# Patient Record
Sex: Female | Born: 1945 | Race: White | Hispanic: No | Marital: Married | Smoking: Never smoker
Health system: Southern US, Community
[De-identification: ages and names within clinical notes are randomized; demographics above are authoritative.]

## PROBLEM LIST (undated history)

## (undated) DIAGNOSIS — Z9889 Other specified postprocedural states: Secondary | ICD-10-CM

## (undated) DIAGNOSIS — M810 Age-related osteoporosis without current pathological fracture: Secondary | ICD-10-CM

## (undated) DIAGNOSIS — I2510 Atherosclerotic heart disease of native coronary artery without angina pectoris: Secondary | ICD-10-CM

## (undated) DIAGNOSIS — M1990 Unspecified osteoarthritis, unspecified site: Secondary | ICD-10-CM

## (undated) DIAGNOSIS — C50919 Malignant neoplasm of unspecified site of unspecified female breast: Secondary | ICD-10-CM

## (undated) DIAGNOSIS — C801 Malignant (primary) neoplasm, unspecified: Secondary | ICD-10-CM

## (undated) DIAGNOSIS — I2089 Other forms of angina pectoris: Secondary | ICD-10-CM

## (undated) DIAGNOSIS — G500 Trigeminal neuralgia: Secondary | ICD-10-CM

## (undated) DIAGNOSIS — I471 Supraventricular tachycardia: Secondary | ICD-10-CM

## (undated) DIAGNOSIS — I208 Other forms of angina pectoris: Secondary | ICD-10-CM

## (undated) DIAGNOSIS — Z923 Personal history of irradiation: Secondary | ICD-10-CM

## (undated) DIAGNOSIS — Z974 Presence of external hearing-aid: Secondary | ICD-10-CM

## (undated) DIAGNOSIS — I10 Essential (primary) hypertension: Secondary | ICD-10-CM

## (undated) DIAGNOSIS — R112 Nausea with vomiting, unspecified: Secondary | ICD-10-CM

## (undated) HISTORY — DX: Other forms of angina pectoris: I20.8

## (undated) HISTORY — PX: CARDIAC CATHETERIZATION: SHX172

## (undated) HISTORY — DX: Age-related osteoporosis without current pathological fracture: M81.0

## (undated) HISTORY — DX: Supraventricular tachycardia: I47.1

## (undated) HISTORY — DX: Unspecified osteoarthritis, unspecified site: M19.90

## (undated) HISTORY — DX: Trigeminal neuralgia: G50.0

## (undated) HISTORY — DX: Malignant (primary) neoplasm, unspecified: C80.1

## (undated) HISTORY — DX: Other forms of angina pectoris: I20.89

## (undated) SURGERY — BREAST LUMPECTOMY
Anesthesia: General | Laterality: Right

---

## 1976-05-23 HISTORY — PX: VAGINAL HYSTERECTOMY: SUR661

## 1980-05-23 HISTORY — PX: BREAST FIBROADENOMA SURGERY: SHX580

## 1983-05-24 HISTORY — PX: BREAST CYST ASPIRATION: SHX578

## 1983-05-24 HISTORY — PX: BREAST BIOPSY: SHX20

## 2010-05-23 DIAGNOSIS — I471 Supraventricular tachycardia, unspecified: Secondary | ICD-10-CM

## 2010-05-23 HISTORY — DX: Supraventricular tachycardia, unspecified: I47.10

## 2010-05-23 HISTORY — PX: BUNIONECTOMY WITH HAMMERTOE RECONSTRUCTION: SHX5600

## 2010-05-23 HISTORY — DX: Supraventricular tachycardia: I47.1

## 2010-09-14 ENCOUNTER — Ambulatory Visit: Payer: Self-pay | Admitting: Internal Medicine

## 2010-11-16 ENCOUNTER — Ambulatory Visit: Payer: Self-pay | Admitting: Anesthesiology

## 2010-11-18 ENCOUNTER — Ambulatory Visit: Payer: Self-pay | Admitting: Podiatry

## 2011-01-13 ENCOUNTER — Ambulatory Visit: Payer: Self-pay | Admitting: Neurology

## 2011-01-18 ENCOUNTER — Ambulatory Visit: Payer: Self-pay | Admitting: Neurology

## 2011-05-24 HISTORY — PX: COLONOSCOPY: SHX174

## 2011-05-24 HISTORY — PX: HALLUX VALGUS CORRECTION: SUR315

## 2011-09-08 ENCOUNTER — Ambulatory Visit: Payer: Self-pay | Admitting: Unknown Physician Specialty

## 2011-10-12 ENCOUNTER — Ambulatory Visit: Payer: Self-pay | Admitting: Internal Medicine

## 2011-11-17 ENCOUNTER — Ambulatory Visit: Payer: Self-pay | Admitting: Podiatry

## 2012-01-31 ENCOUNTER — Ambulatory Visit: Payer: Self-pay | Admitting: Neurology

## 2012-05-23 HISTORY — PX: CRANIOPLASTY: SUR330

## 2012-09-06 DIAGNOSIS — M2030 Hallux varus (acquired), unspecified foot: Secondary | ICD-10-CM | POA: Insufficient documentation

## 2012-10-16 ENCOUNTER — Ambulatory Visit: Payer: Self-pay | Admitting: Internal Medicine

## 2012-12-28 ENCOUNTER — Ambulatory Visit: Payer: Self-pay | Admitting: Neurology

## 2013-03-21 DIAGNOSIS — I471 Supraventricular tachycardia: Secondary | ICD-10-CM | POA: Insufficient documentation

## 2013-06-11 DIAGNOSIS — G500 Trigeminal neuralgia: Secondary | ICD-10-CM | POA: Insufficient documentation

## 2013-06-11 DIAGNOSIS — H532 Diplopia: Secondary | ICD-10-CM | POA: Insufficient documentation

## 2013-06-12 DIAGNOSIS — Z79899 Other long term (current) drug therapy: Secondary | ICD-10-CM | POA: Insufficient documentation

## 2013-10-22 ENCOUNTER — Ambulatory Visit: Payer: Self-pay | Admitting: Internal Medicine

## 2014-05-23 DIAGNOSIS — Z923 Personal history of irradiation: Secondary | ICD-10-CM

## 2014-05-23 DIAGNOSIS — C50919 Malignant neoplasm of unspecified site of unspecified female breast: Secondary | ICD-10-CM

## 2014-05-23 HISTORY — DX: Malignant neoplasm of unspecified site of unspecified female breast: C50.919

## 2014-05-23 HISTORY — DX: Personal history of irradiation: Z92.3

## 2014-12-29 ENCOUNTER — Other Ambulatory Visit: Payer: Self-pay | Admitting: Internal Medicine

## 2014-12-29 DIAGNOSIS — Z1231 Encounter for screening mammogram for malignant neoplasm of breast: Secondary | ICD-10-CM

## 2014-12-30 ENCOUNTER — Other Ambulatory Visit (HOSPITAL_COMMUNITY): Payer: Self-pay | Admitting: Surgical

## 2014-12-30 DIAGNOSIS — D496 Neoplasm of unspecified behavior of brain: Secondary | ICD-10-CM

## 2015-01-20 ENCOUNTER — Ambulatory Visit
Admission: RE | Admit: 2015-01-20 | Discharge: 2015-01-20 | Disposition: A | Discharge status: Home / Self Care | Payer: Medicare Other | Source: Ambulatory Visit | Attending: Surgical | Admitting: Surgical

## 2015-01-20 DIAGNOSIS — D496 Neoplasm of unspecified behavior of brain: Secondary | ICD-10-CM

## 2015-01-20 DIAGNOSIS — D320 Benign neoplasm of cerebral meninges: Secondary | ICD-10-CM | POA: Insufficient documentation

## 2015-01-20 MED ORDER — GADOBENATE DIMEGLUMINE 529 MG/ML IV SOLN
15.0000 mL | Freq: Once | INTRAVENOUS | Status: AC | PRN
  Administered 2015-01-20: 12 mL via INTRAVENOUS

## 2015-01-28 ENCOUNTER — Ambulatory Visit: Payer: Medicare Other | Attending: Internal Medicine

## 2015-02-11 ENCOUNTER — Other Ambulatory Visit: Payer: Self-pay | Admitting: Internal Medicine

## 2015-02-11 ENCOUNTER — Ambulatory Visit
Admission: RE | Admit: 2015-02-11 | Discharge: 2015-02-11 | Disposition: A | Discharge status: Home / Self Care | Payer: Medicare Other | Source: Ambulatory Visit | Attending: Internal Medicine | Admitting: Internal Medicine

## 2015-02-11 DIAGNOSIS — Z1231 Encounter for screening mammogram for malignant neoplasm of breast: Secondary | ICD-10-CM | POA: Diagnosis not present

## 2015-02-11 DIAGNOSIS — R921 Mammographic calcification found on diagnostic imaging of breast: Secondary | ICD-10-CM | POA: Insufficient documentation

## 2015-02-12 ENCOUNTER — Other Ambulatory Visit: Payer: Self-pay | Admitting: Internal Medicine

## 2015-02-12 DIAGNOSIS — R928 Other abnormal and inconclusive findings on diagnostic imaging of breast: Secondary | ICD-10-CM

## 2015-02-17 ENCOUNTER — Other Ambulatory Visit: Payer: Self-pay | Admitting: Internal Medicine

## 2015-02-17 ENCOUNTER — Ambulatory Visit
Admission: RE | Admit: 2015-02-17 | Discharge: 2015-02-17 | Disposition: A | Discharge status: Home / Self Care | Payer: Medicare Other | Source: Ambulatory Visit | Attending: Internal Medicine | Admitting: Internal Medicine

## 2015-02-17 DIAGNOSIS — R921 Mammographic calcification found on diagnostic imaging of breast: Secondary | ICD-10-CM

## 2015-02-17 DIAGNOSIS — R928 Other abnormal and inconclusive findings on diagnostic imaging of breast: Secondary | ICD-10-CM

## 2015-03-02 ENCOUNTER — Ambulatory Visit
Admission: RE | Admit: 2015-03-02 | Discharge: 2015-03-02 | Disposition: A | Discharge status: Home / Self Care | Payer: Medicare Other | Source: Ambulatory Visit | Attending: Internal Medicine | Admitting: Internal Medicine

## 2015-03-02 DIAGNOSIS — R921 Mammographic calcification found on diagnostic imaging of breast: Secondary | ICD-10-CM | POA: Diagnosis not present

## 2015-03-02 HISTORY — PX: BREAST BIOPSY: SHX20

## 2015-03-03 LAB — SURGICAL PATHOLOGY

## 2015-03-04 ENCOUNTER — Encounter: Payer: Self-pay | Admitting: *Deleted

## 2015-03-10 ENCOUNTER — Ambulatory Visit: Payer: Self-pay | Admitting: General Surgery

## 2015-03-16 ENCOUNTER — Encounter: Payer: Self-pay | Admitting: General Surgery

## 2015-03-18 ENCOUNTER — Ambulatory Visit (INDEPENDENT_AMBULATORY_CARE_PROVIDER_SITE_OTHER): Payer: Medicare Other | Admitting: General Surgery

## 2015-03-18 ENCOUNTER — Ambulatory Visit: Payer: Medicare Other

## 2015-03-18 ENCOUNTER — Encounter: Payer: Self-pay | Admitting: General Surgery

## 2015-03-18 VITALS — BP 108/82 | HR 76 | Resp 14 | Ht 62.5 in | Wt 139.0 lb

## 2015-03-18 DIAGNOSIS — D0511 Intraductal carcinoma in situ of right breast: Secondary | ICD-10-CM | POA: Diagnosis not present

## 2015-03-18 DIAGNOSIS — D0510 Intraductal carcinoma in situ of unspecified breast: Secondary | ICD-10-CM | POA: Insufficient documentation

## 2015-03-18 NOTE — H&P (Signed)
HPI  Kristin Davies is a 69 y.o. female. who presents for a breast evaluation. The most recent mammogram was done on 02-11-15. She then had a right breast steotatic breast biopsy on 03-02-15 which shoed DCIS. She states the right breast is still a little sore from the biopsy.  Patient does perform regular self breast checks and gets regular mammograms done. She is been aware of thickening in the upper-outer quadrant of the right breast most of her adult life. No recent change.  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  I personally reviewed the above history in the patient's presence.  HPI  Past Medical History   Diagnosis  Date   .  Osteoporosis    .  Arthritis    .  Osteoarthritis    .  SVT (supraventricular tachycardia) ([place])  2012   .  Trigeminal neuralgia     Past Surgical History   Procedure  Laterality  Date   .  Breast cyst aspiration  Right  1985     Negative   .  Breast biopsy  Right  1985     Negative   .  Breast biopsy  Right  03/02/2015     DCIS   .  Bunionectomy with hammertoe reconstruction  Left  2012   .  Hallux valgus correction   2013   .  Vaginal hysterectomy   1978   .  Cranioplasty   2014   .  Colonoscopy   2013   .  Breast fibroadenoma surgery  Right  1982     [place]    Family History   Problem  Relation  Age of Onset   .  Breast cancer  Paternal Aunt      mid 93's   .  Alzheimer's disease  Mother     Social History  Social History   Substance Use Topics   .  Smoking status:  Never Smoker   .  Smokeless tobacco:  Never Used   .  Alcohol Use:  0.0 oz/week     0 Standard drinks or equivalent per week      Comment: 1/day    No Known Allergies  Current Outpatient Prescriptions   Medication  Sig  Dispense  Refill   .  alendronate (FOSAMAX) 70 MG tablet  Take by mouth once a week.     .  Cholecalciferol (VITAMIN D3) 1000 UNITS CAPS  Take by mouth.     .  Garlic 8182 MG CAPS  Take by mouth.       .  Glucosamine-Chondroit-Vit C-Mn (GLUCOSAMINE CHONDR 1500 COMPLX) CAPS  Take by mouth daily.     .  metoprolol succinate (TOPROL-XL) 50 MG 24 hr tablet  Take 50 mg by mouth daily.     .  Multiple Vitamin (MULTIVITAMIN) capsule  Take 1 capsule by mouth daily.     .  Omega-3 Fatty Acids (OMEGA-3 FISH OIL PO)  Take 1,500 mg by mouth.     .  pregabalin (LYRICA) 100 MG capsule  TAKE 1 CAPSULE BY MOUTH TWICE DAILY     .  vitamin C (ASCORBIC ACID) 500 MG tablet  Take by mouth.      No current facility-administered medications for this visit.    Review of Systems  Review of Systems  Constitutional: Negative.  Respiratory: Negative.  Cardiovascular: Negative.   Blood pressure 108/82, pulse 76, resp. rate 14,  height 5' 2.5" (1.588 m), weight 139 lb (63.05 kg).  Physical Exam  Physical Exam  Constitutional: She is oriented to person, place, and time. She appears well-developed and well-nourished.  HENT:  Mouth/Throat: Oropharynx is clear and moist.  Eyes: Conjunctivae are normal. No scleral icterus.  Neck: Neck supple.  Cardiovascular: Normal rate, regular rhythm and normal heart sounds.  Pulmonary/Chest: Effort normal and breath sounds normal. Right breast exhibits no inverted nipple, no mass, no nipple discharge, no skin change and no tenderness. Left breast exhibits no inverted nipple, no mass, no nipple discharge, no skin change and no tenderness.    Right breast > left breast. 3 cm area thickening at 10 o'clock 8 CFN right breast.  Lymphadenopathy:  She has no cervical adenopathy.  She has no axillary adenopathy.  Neurological: She is alert and oriented to person, place, and time.  Skin: Skin is warm and dry.  Psychiatric: Her behavior is normal.   Data Reviewed  October 2016 mammogram.  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  Postbiopsy breast imaging shows the biopsy clip in the area of focal density in the upper outer quadrant.  Ultrasound examination of the right  breast of the 10:00 position, 7 cm from the nipple shows the previously placed biopsy clip as well as a 1.4 x 1.6 x 1.7 cm hypoechoic mass corresponding to the density noted on mammography. No axillary adenopathy is identified. BIRAD-6.  Assessment   DCIS of the right breast.   Plan   Rated 50% of the visit was spent reviewing the options for management of early-stage breast cancer. While breast conservation and mastectomy are equivalent, for DCIS most women are amenable to breast conservation. The role for post procedure radiation therapy (whole breast versus partial breast) sees was briefly touched upon. Hormone receptor assay has been appropriately deferred until the formal excision is completed.  If the patient is up stage II invasive mammary carcinoma sentinel node biopsy will be completed.  The patient was given an informational brochure as well as several appropriate area at sites for review. She'll contact the office if she desires any additional information.   Patient's surgery has been scheduled for 03-26-15 at [facility].  PCP: Sol Passer  03/18/2015, 5:07 PM

## 2015-03-18 NOTE — Patient Instructions (Addendum)
The patient is aware to call back for any questions or concerns.  Patient's surgery has been scheduled for 03-26-15 at [facility].

## 2015-03-18 NOTE — Progress Notes (Signed)
Patient ID: Kristin Davies, female   DOB: Jun 16, 1945, 69 y.o.   MRN: [id number]  Chief Complaint  Patient presents with  . Breast Problem    HPI Kristin Davies is a 69 y.o. female.  who presents for a breast evaluation. The most recent mammogram was done on 02-11-15. She then had a right breast steotatic breast biopsy on 03-02-15 which shoed DCIS. She states the right breast is still a little sore from the biopsy. Patient does perform regular self breast checks and gets regular mammograms done.  She is been aware of thickening in the upper-outer quadrant of the right breast most of her adult life. No recent change. 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 I personally reviewed the above history in the patient's presence. HPI  Past Medical History  Diagnosis Date  . Osteoporosis   . Arthritis   . Osteoarthritis   . SVT (supraventricular tachycardia) ([place]) 2012  . Trigeminal neuralgia     Past Surgical History  Procedure Laterality Date  . Breast cyst aspiration Right 1985    Negative  . Breast biopsy Right 1985    Negative  . Breast biopsy Right 03/02/2015    DCIS  . Bunionectomy with hammertoe reconstruction Left 2012  . Hallux valgus correction  2013  . Vaginal hysterectomy  1978  . Cranioplasty  2014  . Colonoscopy  2013  . Breast fibroadenoma surgery Right 1982    [place]     Family History  Problem Relation Age of Onset  . Breast cancer Paternal Aunt     mid 41's  . Alzheimer's disease Mother     Social History Social History  Substance Use Topics  . Smoking status: Never Smoker   . Smokeless tobacco: Never Used  . Alcohol Use: 0.0 oz/week    0 Standard drinks or equivalent per week     Comment: 1/day    No Known Allergies  Current Outpatient Prescriptions  Medication Sig Dispense Refill  . alendronate (FOSAMAX) 70 MG tablet Take by mouth once a week.     . Cholecalciferol (VITAMIN D3) 1000 UNITS  CAPS Take by mouth.    . Garlic 7001 MG CAPS Take by mouth.    . Glucosamine-Chondroit-Vit C-Mn (GLUCOSAMINE CHONDR 1500 COMPLX) CAPS Take by mouth daily.     . metoprolol succinate (TOPROL-XL) 50 MG 24 hr tablet Take 50 mg by mouth daily.     . Multiple Vitamin (MULTIVITAMIN) capsule Take 1 capsule by mouth daily.    . Omega-3 Fatty Acids (OMEGA-3 FISH OIL PO) Take 1,500 mg by mouth.    . pregabalin (LYRICA) 100 MG capsule TAKE 1 CAPSULE BY MOUTH TWICE DAILY    . vitamin C (ASCORBIC ACID) 500 MG tablet Take by mouth.     No current facility-administered medications for this visit.    Review of Systems Review of Systems  Constitutional: Negative.   Respiratory: Negative.   Cardiovascular: Negative.     Blood pressure 108/82, pulse 76, resp. rate 14, height 5' 2.5" (1.588 m), weight 139 lb (63.05 kg).  Physical Exam Physical Exam  Constitutional: She is oriented to person, place, and time. She appears well-developed and well-nourished.  HENT:  Mouth/Throat: Oropharynx is clear and moist.  Eyes: Conjunctivae are normal. No scleral icterus.  Neck: Neck supple.  Cardiovascular: Normal rate, regular rhythm and normal heart sounds.   Pulmonary/Chest: Effort normal and breath sounds normal. Right breast exhibits  no inverted nipple, no mass, no nipple discharge, no skin change and no tenderness. Left breast exhibits no inverted nipple, no mass, no nipple discharge, no skin change and no tenderness.    Right breast > left breast. 3 cm area thickening at 10 o'clock 8 CFN right breast.  Lymphadenopathy:    She has no cervical adenopathy.    She has no axillary adenopathy.  Neurological: She is alert and oriented to person, place, and time.  Skin: Skin is warm and dry.  Psychiatric: Her behavior is normal.    Data Reviewed October 2016 mammogram.  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  Postbiopsy breast imaging shows the biopsy clip in the area of focal density in the  upper outer quadrant.  Ultrasound examination of the right breast of the 10:00 position, 7 cm from the nipple shows the previously placed biopsy clip as well as a 1.4 x 1.6 x 1.7 cm hypoechoic mass corresponding to the density noted on mammography. No axillary adenopathy is identified. BIRAD-6.  Assessment    DCIS of the right breast.    Plan      Rated 50% of the visit was spent reviewing the options for management of early-stage breast cancer. While breast conservation and mastectomy are equivalent, for DCIS most women are amenable to breast conservation. The role for post procedure radiation therapy (whole breast versus partial breast) sees was briefly touched upon. Hormone receptor assay has been appropriately deferred until the formal excision is completed.  If the patient is up stage II invasive mammary carcinoma sentinel node biopsy will be completed.  The patient was given an informational brochure as well as several appropriate area at sites for review. She'll contact the office if she desires any additional information.    Patient's surgery has been scheduled for 03-26-15 at [facility].  PCP:  Sol Passer 03/18/2015, 5:07 PM

## 2015-03-23 ENCOUNTER — Encounter
Admission: RE | Admit: 2015-03-23 | Discharge: 2015-03-23 | Disposition: A | Discharge status: Home / Self Care | Payer: Medicare Other | Source: Ambulatory Visit | Attending: General Surgery | Admitting: General Surgery

## 2015-03-23 DIAGNOSIS — M810 Age-related osteoporosis without current pathological fracture: Secondary | ICD-10-CM | POA: Diagnosis not present

## 2015-03-23 DIAGNOSIS — I471 Supraventricular tachycardia: Secondary | ICD-10-CM

## 2015-03-23 DIAGNOSIS — G709 Myoneural disorder, unspecified: Secondary | ICD-10-CM | POA: Diagnosis not present

## 2015-03-23 DIAGNOSIS — Z79899 Other long term (current) drug therapy: Secondary | ICD-10-CM | POA: Diagnosis not present

## 2015-03-23 DIAGNOSIS — Z7982 Long term (current) use of aspirin: Secondary | ICD-10-CM | POA: Diagnosis not present

## 2015-03-23 DIAGNOSIS — D0511 Intraductal carcinoma in situ of right breast: Secondary | ICD-10-CM | POA: Diagnosis not present

## 2015-03-23 DIAGNOSIS — G500 Trigeminal neuralgia: Secondary | ICD-10-CM | POA: Diagnosis not present

## 2015-03-23 HISTORY — DX: Nausea with vomiting, unspecified: R11.2

## 2015-03-23 HISTORY — DX: Other specified postprocedural states: Z98.890

## 2015-03-23 LAB — CBC
HEMATOCRIT: 41 % (ref 35.0–47.0)
Hemoglobin: 13.7 g/dL (ref 12.0–16.0)
MCH: 31.8 pg (ref 26.0–34.0)
MCHC: 33.4 g/dL (ref 32.0–36.0)
MCV: 95.1 fL (ref 80.0–100.0)
Platelets: 213 10*3/uL (ref 150–440)
RBC: 4.31 MIL/uL (ref 3.80–5.20)
RDW: 13.6 % (ref 11.5–14.5)
WBC: 5 10*3/uL (ref 3.6–11.0)

## 2015-03-23 LAB — SURGICAL PCR SCREEN
MRSA, PCR: NEGATIVE
Staphylococcus aureus: NEGATIVE

## 2015-03-23 NOTE — Patient Instructions (Signed)
  Your procedure is scheduled on: Thursday 03/26/2015 Report to Day Surgery. 2ND FLOOR MEDICAL MALL ENTRANCE To find out your arrival time please call [phone] between 1PM - 3PM on Wednesday 03/25/2015.  Remember: Instructions that are not followed completely may result in serious medical risk, up to and including death, or upon the discretion of your surgeon and anesthesiologist your surgery may need to be rescheduled.    __X__ 1. Do not eat food or drink liquids after midnight. No gum chewing or hard candies.     __X__ 2. No Alcohol for 24 hours before or after surgery.   ____ 3. Bring all medications with you on the day of surgery if instructed.    __X__ 4. Notify your doctor if there is any change in your medical condition     (cold, fever, infections).     Do not wear jewelry, make-up, hairpins, clips or nail polish.  Do not wear lotions, powders, or perfumes.   Do not shave 48 hours prior to surgery. Men may shave face and neck.  Do not bring valuables to the hospital.    [facility] is not responsible for any belongings or valuables.               Contacts, dentures or bridgework may not be worn into surgery.  Leave your suitcase in the car. After surgery it may be brought to your room.  For patients admitted to the hospital, discharge time is determined by your                treatment team.   Patients discharged the day of surgery will not be allowed to drive home.   Please read over the following fact sheets that you were given:   Surgical Site Infection Prevention   ____ Take these medicines the morning of surgery with A SIP OF WATER:    1.   2.   3.   4.  5.  6.  ____ Fleet Enema (as directed)   __X__ Use CHG Soap as directed  ____ Use inhalers on the day of surgery  ____ Stop metformin 2 days prior to surgery    ____ Take 1/2 of usual insulin dose the night before surgery and none on the morning of surgery.   __X__ Stop Coumadin/Plavix/aspirin on AS  DIRECTED  ____ Stop Anti-inflammatories on    __X__ Stop supplements until after surgery.    ____ Bring C-Pap to the hospital.

## 2015-03-25 NOTE — OR Nursing (Signed)
ECG dated 11/16/10 in [facility] notes inferior infarct, age undetermined

## 2015-03-26 ENCOUNTER — Ambulatory Visit: Payer: Medicare Other | Admitting: Certified Registered Nurse Anesthetist

## 2015-03-26 ENCOUNTER — Encounter
Admission: RE | Disposition: A | Discharge status: Home / Self Care | Payer: Self-pay | Source: Ambulatory Visit | Attending: General Surgery

## 2015-03-26 ENCOUNTER — Encounter: Payer: Self-pay | Admitting: *Deleted

## 2015-03-26 ENCOUNTER — Ambulatory Visit
Admission: RE | Admit: 2015-03-26 | Discharge: 2015-03-26 | Disposition: A | Discharge status: Home / Self Care | Payer: Medicare Other | Source: Ambulatory Visit | Attending: General Surgery | Admitting: General Surgery

## 2015-03-26 DIAGNOSIS — Z7982 Long term (current) use of aspirin: Secondary | ICD-10-CM | POA: Insufficient documentation

## 2015-03-26 DIAGNOSIS — D0511 Intraductal carcinoma in situ of right breast: Secondary | ICD-10-CM | POA: Diagnosis not present

## 2015-03-26 DIAGNOSIS — G500 Trigeminal neuralgia: Secondary | ICD-10-CM | POA: Insufficient documentation

## 2015-03-26 DIAGNOSIS — C801 Malignant (primary) neoplasm, unspecified: Secondary | ICD-10-CM

## 2015-03-26 DIAGNOSIS — M810 Age-related osteoporosis without current pathological fracture: Secondary | ICD-10-CM | POA: Insufficient documentation

## 2015-03-26 DIAGNOSIS — Z79899 Other long term (current) drug therapy: Secondary | ICD-10-CM | POA: Insufficient documentation

## 2015-03-26 DIAGNOSIS — C50411 Malignant neoplasm of upper-outer quadrant of right female breast: Secondary | ICD-10-CM | POA: Diagnosis not present

## 2015-03-26 DIAGNOSIS — G709 Myoneural disorder, unspecified: Secondary | ICD-10-CM | POA: Insufficient documentation

## 2015-03-26 HISTORY — DX: Malignant (primary) neoplasm, unspecified: C80.1

## 2015-03-26 HISTORY — PX: BREAST LUMPECTOMY: SHX2

## 2015-03-26 SURGERY — BREAST LUMPECTOMY
Anesthesia: General | Laterality: Right | Wound class: Clean

## 2015-03-26 MED ORDER — SODIUM CHLORIDE 0.9 % IJ SOLN
INTRAMUSCULAR | Status: AC
  Filled 2015-03-26: qty 10

## 2015-03-26 MED ORDER — ONDANSETRON HCL 4 MG/2ML IJ SOLN
INTRAMUSCULAR | Status: AC
  Filled 2015-03-26: qty 2

## 2015-03-26 MED ORDER — ACETAMINOPHEN 10 MG/ML IV SOLN
INTRAVENOUS | Status: AC
  Filled 2015-03-26: qty 100

## 2015-03-26 MED ORDER — GLYCOPYRROLATE 0.2 MG/ML IJ SOLN
INTRAMUSCULAR | Status: DC | PRN
  Administered 2015-03-26: 0.2 mg via INTRAVENOUS

## 2015-03-26 MED ORDER — HYDROCODONE-ACETAMINOPHEN 5-325 MG PO TABS: 1.0000 | ORAL_TABLET | ORAL | Status: DC | PRN

## 2015-03-26 MED ORDER — FENTANYL CITRATE (PF) 100 MCG/2ML IJ SOLN
INTRAMUSCULAR | Status: AC
  Filled 2015-03-26: qty 2

## 2015-03-26 MED ORDER — SCOPOLAMINE 1 MG/3DAYS TD PT72
MEDICATED_PATCH | TRANSDERMAL | Status: AC
  Filled 2015-03-26: qty 1

## 2015-03-26 MED ORDER — KETOROLAC TROMETHAMINE 30 MG/ML IJ SOLN
INTRAMUSCULAR | Status: DC | PRN
  Administered 2015-03-26: 15 mg via INTRAVENOUS

## 2015-03-26 MED ORDER — BUPIVACAINE-EPINEPHRINE (PF) 0.5% -1:200000 IJ SOLN
INTRAMUSCULAR | Status: DC | PRN
  Administered 2015-03-26: 30 mL

## 2015-03-26 MED ORDER — ACETAMINOPHEN 10 MG/ML IV SOLN
INTRAVENOUS | Status: DC | PRN
Start: 2015-03-26 — End: 2015-03-26
  Administered 2015-03-26: 1000 mg via INTRAVENOUS

## 2015-03-26 MED ORDER — MIDAZOLAM HCL 2 MG/2ML IJ SOLN
INTRAMUSCULAR | Status: DC | PRN
  Administered 2015-03-26: 2 mg via INTRAVENOUS

## 2015-03-26 MED ORDER — FAMOTIDINE 20 MG PO TABS
ORAL_TABLET | ORAL | Status: AC
  Filled 2015-03-26: qty 1

## 2015-03-26 MED ORDER — PROMETHAZINE HCL 25 MG/ML IJ SOLN
INTRAMUSCULAR | Status: AC
  Filled 2015-03-26: qty 1

## 2015-03-26 MED ORDER — FENTANYL CITRATE (PF) 100 MCG/2ML IJ SOLN
INTRAMUSCULAR | Status: DC | PRN
  Administered 2015-03-26 (×2): 50 ug via INTRAVENOUS

## 2015-03-26 MED ORDER — DEXAMETHASONE SODIUM PHOSPHATE 4 MG/ML IJ SOLN
INTRAMUSCULAR | Status: DC | PRN
  Administered 2015-03-26: 5 mg via INTRAVENOUS

## 2015-03-26 MED ORDER — ONDANSETRON HCL 4 MG/2ML IJ SOLN
INTRAMUSCULAR | Status: DC | PRN
  Administered 2015-03-26: 4 mg via INTRAVENOUS

## 2015-03-26 MED ORDER — FAMOTIDINE 20 MG PO TABS
20.0000 mg | ORAL_TABLET | Freq: Once | ORAL | Status: AC
  Administered 2015-03-26: 20 mg via ORAL

## 2015-03-26 MED ORDER — LIDOCAINE HCL (CARDIAC) 20 MG/ML IV SOLN
INTRAVENOUS | Status: DC | PRN
  Administered 2015-03-26: 100 mg via INTRAVENOUS

## 2015-03-26 MED ORDER — SCOPOLAMINE 1 MG/3DAYS TD PT72
1.0000 | MEDICATED_PATCH | TRANSDERMAL | Status: DC
  Administered 2015-03-26: 1.5 mg via TRANSDERMAL

## 2015-03-26 MED ORDER — LACTATED RINGERS IV SOLN
INTRAVENOUS | Status: DC
  Administered 2015-03-26: 09:00:00 via INTRAVENOUS

## 2015-03-26 MED ORDER — PROPOFOL 10 MG/ML IV BOLUS
INTRAVENOUS | Status: DC | PRN
  Administered 2015-03-26: 150 mg via INTRAVENOUS

## 2015-03-26 MED ORDER — BUPIVACAINE-EPINEPHRINE (PF) 0.5% -1:200000 IJ SOLN
INTRAMUSCULAR | Status: AC
  Filled 2015-03-26: qty 30

## 2015-03-26 MED ORDER — FENTANYL CITRATE (PF) 100 MCG/2ML IJ SOLN
25.0000 ug | INTRAMUSCULAR | Status: DC | PRN
  Administered 2015-03-26 (×2): 25 ug via INTRAVENOUS

## 2015-03-26 MED ORDER — PROMETHAZINE HCL 25 MG/ML IJ SOLN
6.2500 mg | INTRAMUSCULAR | Status: DC | PRN
Start: 2015-03-26 — End: 2015-03-26
  Administered 2015-03-26: 3.1 mg via INTRAVENOUS

## 2015-03-26 SURGICAL SUPPLY — 46 items
BANDAGE ELASTIC 6 CLIP NS LF (GAUZE/BANDAGES/DRESSINGS) ×3 IMPLANT
BLADE SURG 15 STRL SS SAFETY (BLADE) ×6 IMPLANT
BNDG GAUZE 4.5X4.1 6PLY STRL (MISCELLANEOUS) ×3 IMPLANT
BULB RESERV EVAC DRAIN JP 100C (MISCELLANEOUS) IMPLANT
CANISTER SUCT 1200ML W/VALVE (MISCELLANEOUS) ×3 IMPLANT
CHLORAPREP W/TINT 26ML (MISCELLANEOUS) ×3 IMPLANT
CLOSURE WOUND 1/2 X4 (GAUZE/BANDAGES/DRESSINGS) ×1
CNTNR SPEC 2.5X3XGRAD LEK (MISCELLANEOUS)
CONT SPEC 4OZ STER OR WHT (MISCELLANEOUS)
CONTAINER SPEC 2.5X3XGRAD LEK (MISCELLANEOUS) IMPLANT
COVER PROBE FLX POLY STRL (MISCELLANEOUS) ×3 IMPLANT
DEVICE DUBIN SPECIMEN MAMMOGRA (MISCELLANEOUS) ×3 IMPLANT
DRAIN CHANNEL JP 15F RND 16 (MISCELLANEOUS) IMPLANT
DRAPE LAPAROTOMY TRNSV 106X77 (MISCELLANEOUS) ×3 IMPLANT
DRSG TELFA 3X8 NADH (GAUZE/BANDAGES/DRESSINGS) ×3 IMPLANT
ELECT CAUTERY BLADE TIP 2.5 (TIP) ×3
ELECTRODE CAUTERY BLDE TIP 2.5 (TIP) ×1 IMPLANT
GAUZE FLUFF 18X24 1PLY STRL (GAUZE/BANDAGES/DRESSINGS) ×3 IMPLANT
GAUZE SPONGE 4X4 12PLY STRL (GAUZE/BANDAGES/DRESSINGS) IMPLANT
GLOVE BIO SURGEON STRL SZ7.5 (GLOVE) ×6 IMPLANT
GLOVE INDICATOR 8.0 STRL GRN (GLOVE) ×6 IMPLANT
GOWN STRL REUS W/ TWL LRG LVL3 (GOWN DISPOSABLE) ×2 IMPLANT
GOWN STRL REUS W/TWL LRG LVL3 (GOWN DISPOSABLE) ×4
HARMONIC SCALPEL FOCUS (MISCELLANEOUS) IMPLANT
KIT RM TURNOVER STRD PROC AR (KITS) ×3 IMPLANT
LABEL OR SOLS (LABEL) IMPLANT
NDL SAFETY 22GX1.5 (NEEDLE) ×3 IMPLANT
NEEDLE HYPO 25X1 1.5 SAFETY (NEEDLE) ×3 IMPLANT
PACK BASIN MINOR ARMC (MISCELLANEOUS) ×3 IMPLANT
PAD GROUND ADULT SPLIT (MISCELLANEOUS) ×3 IMPLANT
SHEARS FOC LG CVD HARMONIC 17C (MISCELLANEOUS) IMPLANT
STRIP CLOSURE SKIN 1/2X4 (GAUZE/BANDAGES/DRESSINGS) ×2 IMPLANT
SUT ETHILON 3-0 FS-10 30 BLK (SUTURE) ×3
SUT SILK 2 0 (SUTURE) ×2
SUT SILK 2-0 18XBRD TIE 12 (SUTURE) ×1 IMPLANT
SUT VIC AB 2-0 CT1 27 (SUTURE) ×4
SUT VIC AB 2-0 CT1 TAPERPNT 27 (SUTURE) ×2 IMPLANT
SUT VIC AB 4-0 FS2 27 (SUTURE) ×3 IMPLANT
SUT VICRYL+ 3-0 144IN (SUTURE) ×3 IMPLANT
SUTURE EHLN 3-0 FS-10 30 BLK (SUTURE) ×1 IMPLANT
SWABSTK COMLB BENZOIN TINCTURE (MISCELLANEOUS) ×3 IMPLANT
SYR BULB IRRIG 60ML STRL (SYRINGE) ×3 IMPLANT
SYR CONTROL 10ML (SYRINGE) ×3 IMPLANT
SYRINGE 10CC LL (SYRINGE) ×3 IMPLANT
TAPE TRANSPORE STRL 2 31045 (GAUZE/BANDAGES/DRESSINGS) ×3 IMPLANT
WATER STERILE IRR 1000ML POUR (IV SOLUTION) ×3 IMPLANT

## 2015-03-26 NOTE — Discharge Instructions (Addendum)

## 2015-03-26 NOTE — Progress Notes (Signed)
Nursing student, Freddie Breech to be observing in room - patient and Beryle Quant, OR RN, aware of same

## 2015-03-26 NOTE — Anesthesia Procedure Notes (Signed)
Procedure Name: LMA Insertion Date/Time: 03/26/2015 9:40 AM Performed by: Johnna Acosta Pre-anesthesia Checklist: Patient identified, Emergency Drugs available, Suction available, Patient being monitored and Timeout performed Patient Re-evaluated:Patient Re-evaluated prior to inductionOxygen Delivery Method: Circle system utilized Preoxygenation: Pre-oxygenation with 100% oxygen Intubation Type: IV induction Ventilation: Mask ventilation without difficulty LMA Size: 3.0 Number of attempts: 1 Tube secured with: Tape Dental Injury: Teeth and Oropharynx as per pre-operative assessment

## 2015-03-26 NOTE — H&P (Signed)
Kristin Davies [id number] May 14, 1946     HPI: 69 y/o female with DCIS.  For wide excision.   Prescriptions prior to admission  Medication Sig Dispense Refill Last Dose  . acetaminophen (TYLENOL) 500 MG tablet Take 500 mg by mouth every 6 (six) hours as needed.   03/25/2015 at pm  . metoprolol succinate (TOPROL-XL) 50 MG 24 hr tablet Take 50 mg by mouth daily.    03/25/2015 at 2130  . pregabalin (LYRICA) 100 MG capsule TAKE 1 CAPSULE BY MOUTH TWICE DAILY   03/25/2015 at pm  . pseudoephedrine-acetaminophen (TYLENOL SINUS) 30-500 MG TABS tablet Take 1 tablet by mouth every 4 (four) hours as needed.   03/25/2015 at pm  . alendronate (FOSAMAX) 70 MG tablet Take by mouth once a week.    03/25/2015 at am  . Aspirin-Acetaminophen-Caffeine (EXCEDRIN PO) Take 1 tablet by mouth 2 (two) times daily as needed.   03/18/2015  . Cholecalciferol (VITAMIN D3) 1000 UNITS CAPS Take by mouth.   03/18/2015  . Garlic 0100 MG CAPS Take by mouth.   03/18/2015  . Glucosamine-Chondroit-Vit C-Mn (GLUCOSAMINE CHONDR 1500 COMPLX) CAPS Take by mouth daily.    03/18/2015  . Multiple Vitamin (MULTIVITAMIN) capsule Take 1 capsule by mouth daily.   03/18/2015  . Omega-3 Fatty Acids (OMEGA-3 FISH OIL PO) Take 1,500 mg by mouth.   03/18/2015  . vitamin C (ASCORBIC ACID) 500 MG tablet Take by mouth.   03/18/2015   No Known Allergies Past Medical History  Diagnosis Date  . Osteoporosis   . Arthritis   . Osteoarthritis   . SVT (supraventricular tachycardia) ([place]) 2012  . Trigeminal neuralgia   . PONV (postoperative nausea and vomiting)    Past Surgical History  Procedure Laterality Date  . Breast cyst aspiration Right 1985    Negative  . Breast biopsy Right 1985    Negative  . Breast biopsy Right 03/02/2015    DCIS  . Bunionectomy with hammertoe reconstruction Left 2012  . Hallux valgus correction  2013  . Vaginal hysterectomy  1978  . Cranioplasty  2014  . Colonoscopy  2013  . Breast fibroadenoma surgery Right [place]    Social History   Social History  . Marital Status: Married    Spouse Name: N/A  . Number of Children: N/A  . Years of Education: N/A   Occupational History  . Not on file.   Social History Main Topics  . Smoking status: Never Smoker   . Smokeless tobacco: Never Used  . Alcohol Use: 0.0 oz/week    0 Standard drinks or equivalent per week     Comment: 1/day  . Drug Use: No  . Sexual Activity: Not on file   Other Topics Concern  . Not on file   Social History Narrative   Social History   Social History Narrative     ROS: Negative.     PE: HEENT: Negative. Lungs: Clear. Cardio: RR. Kristin Davies 03/26/2015   Assessment/Plan:  Proceed with planned endoscopy. Kristin Davies [id number] Jul 05, 1945     HPI:  Prescriptions prior to admission  Medication Sig Dispense Refill Last Dose  . acetaminophen (TYLENOL) 500 MG tablet Take 500 mg by mouth every 6 (six) hours as needed.   03/25/2015 at pm  . metoprolol succinate (TOPROL-XL) 50 MG 24 hr tablet Take 50 mg by mouth daily.    03/25/2015 at 2130  . pregabalin (LYRICA) 100 MG capsule TAKE 1 CAPSULE BY  MOUTH TWICE DAILY   03/25/2015 at pm  . pseudoephedrine-acetaminophen (TYLENOL SINUS) 30-500 MG TABS tablet Take 1 tablet by mouth every 4 (four) hours as needed.   03/25/2015 at pm  . alendronate (FOSAMAX) 70 MG tablet Take by mouth once a week.    03/25/2015 at am  . Aspirin-Acetaminophen-Caffeine (EXCEDRIN PO) Take 1 tablet by mouth 2 (two) times daily as needed.   03/18/2015  . Cholecalciferol (VITAMIN D3) 1000 UNITS CAPS Take by mouth.   03/18/2015  . Garlic 1610 MG CAPS Take by mouth.   03/18/2015  . Glucosamine-Chondroit-Vit C-Mn (GLUCOSAMINE CHONDR 1500 COMPLX) CAPS Take by mouth daily.    03/18/2015  . Multiple Vitamin (MULTIVITAMIN) capsule Take 1 capsule by mouth daily.   03/18/2015  . Omega-3 Fatty Acids (OMEGA-3 FISH OIL PO) Take 1,500 mg by mouth.   03/18/2015  . vitamin C (ASCORBIC  ACID) 500 MG tablet Take by mouth.   03/18/2015   No Known Allergies Past Medical History  Diagnosis Date  . Osteoporosis   . Arthritis   . Osteoarthritis   . SVT (supraventricular tachycardia) ([place]) 2012  . Trigeminal neuralgia   . PONV (postoperative nausea and vomiting)    Past Surgical History  Procedure Laterality Date  . Breast cyst aspiration Right 1985    Negative  . Breast biopsy Right 1985    Negative  . Breast biopsy Right 03/02/2015    DCIS  . Bunionectomy with hammertoe reconstruction Left 2012  . Hallux valgus correction  2013  . Vaginal hysterectomy  1978  . Cranioplasty  2014  . Colonoscopy  2013  . Breast fibroadenoma surgery Right [place]    Social History   Social History  . Marital Status: Married    Spouse Name: N/A  . Number of Children: N/A  . Years of Education: N/A   Occupational History  . Not on file.   Social History Main Topics  . Smoking status: Never Smoker   . Smokeless tobacco: Never Used  . Alcohol Use: 0.0 oz/week    0 Standard drinks or equivalent per week     Comment: 1/day  . Drug Use: No  . Sexual Activity: Not on file   Other Topics Concern  . Not on file   Social History Narrative   Social History   Social History Narrative     ROS: Negative.     PE: HEENT: Negative. Lungs: Clear. Cardio: RR. Kristin Davies 03/26/2015   Assessment/Plan:  Proceed with planned wide excision.

## 2015-03-26 NOTE — Transfer of Care (Signed)
Immediate Anesthesia Transfer of Care Note  Patient: Kristin Davies  Procedure(s) Performed: Procedure(s): BREAST LUMPECTOMY (Right)  Patient Location: PACU  Anesthesia Type:General  Level of Consciousness: awake and alert   Airway & Oxygen Therapy: Patient Spontanous Breathing and Patient connected to nasal cannula oxygen  Post-op Assessment: Report given to RN and Post -op Vital signs reviewed and stable  Post vital signs: Reviewed and stable  Last Vitals:  Filed Vitals:   03/26/15 0803  BP: 141/88  Pulse: 82  Temp: 35.7 C  Resp: 16    Complications: No apparent anesthesia complications

## 2015-03-26 NOTE — Op Note (Signed)
Preoperative diagnosis: Right breast DCIS.  Postoperative diagnosis: Same.  Operative procedure: Right breast wide excision with ultrasound guidance.  Operative surgeon: Ollen Bowl, M.D.  Anesthesia: Gen. by LMA, Marcaine 0.5%, with 1-200,000 units of epinephrine, 30 mL.  Estimated blood loss: Less than 5 mL.  Clinical note: This 69 year old woman recently had a change in her mammogram and stereotactic biopsy showed evidence of high-grade DCIS. The patient desired breast conservation.  Operative note: With the patient under adequate general anesthesia the breasts chest and neck so was prepped with ChloraPrep and draped. Ultrasound was used to confirm the location of the previously placed biopsy clip. Marcaine was infiltrated for postoperative analgesia. A curvilinear incision from the 9 11:00 position was made and carried aphthous consulting this tissue. The remaining dissection was completed with electrocautery. An area of focal breast parenchymal thickening encompassing the clip was resected with the volume approximately 3 x 5 x 4 cm. The specimen was orientated and specimen radiograph confirmed the previously placed clip to be within the center of the specimen. The breast was elevated off the underlying pectoralis muscle and serratus muscle. This was an approximately with interrupted 2-0 Vicryl figure-of-eight sutures. The deep adipose layer was approximated with interrupted 2-0 Vicryl running suture. The skin was closed with a running 4-0 Vicryl septic suture. The remaining 10 mL of local anesthetic was placed into the small residual biopsy cavity. Benzoin, Steri-Strips, Telfa, fluffed gauze, Kerlix, Ace wrap applied.  The patient tolerated the procedure well and was taken to recovery in stable condition.

## 2015-03-26 NOTE — Anesthesia Preprocedure Evaluation (Signed)
Anesthesia Evaluation  Patient identified by MRN, date of birth, ID band Patient awake    Reviewed: Allergy & Precautions, H&P , NPO status , Patient's Chart, lab work & pertinent test results, reviewed documented beta blocker date and time   History of Anesthesia Complications (+) PONV and history of anesthetic complications  Airway Mallampati: I  TM Distance: >3 FB Neck ROM: full    Dental no notable dental hx. (+) Teeth Intact Permanent bridge on the bottom right:   Pulmonary neg pulmonary ROS,    Pulmonary exam normal breath sounds clear to auscultation       Cardiovascular Exercise Tolerance: Good (-) hypertension(-) angina(-) CAD, (-) Past MI, (-) Cardiac Stents and (-) CABG Normal cardiovascular exam+ dysrhythmias Supra Ventricular Tachycardia (-) Valvular Problems/Murmurs Rhythm:regular Rate:Normal     Neuro/Psych neg Seizures  Neuromuscular disease (trigeminal neuralgia) negative psych ROS   GI/Hepatic negative GI ROS, Neg liver ROS,   Endo/Other  negative endocrine ROS  Renal/GU negative Renal ROS  negative genitourinary   Musculoskeletal   Abdominal   Peds  Hematology negative hematology ROS (+)   Anesthesia Other Findings Past Medical History:   Osteoporosis                                                 Arthritis                                                    Osteoarthritis                                               SVT (supraventricular tachycardia) ([facility])        2012         Trigeminal neuralgia                                         PONV (postoperative nausea and vomiting)                     Reproductive/Obstetrics negative OB ROS                             Anesthesia Physical Anesthesia Plan  ASA: II  Anesthesia Plan: General   Post-op Pain Management:    Induction:   Airway Management Planned:   Additional Equipment:   Intra-op Plan:    Post-operative Plan:   Informed Consent: I have reviewed the patients History and Physical, chart, labs and discussed the procedure including the risks, benefits and alternatives for the proposed anesthesia with the patient or authorized representative who has indicated his/her understanding and acceptance.   Dental Advisory Given  Plan Discussed with: Anesthesiologist, CRNA and Surgeon  Anesthesia Plan Comments:         Anesthesia Quick Evaluation

## 2015-03-30 ENCOUNTER — Telehealth: Payer: Self-pay | Admitting: General Surgery

## 2015-03-30 NOTE — Telephone Encounter (Signed)
Notified margins clear. ER/PR pending. Reports minimal pain. Follow up as scheduled 11/10.

## 2015-03-31 ENCOUNTER — Ambulatory Visit: Payer: Self-pay | Admitting: General Surgery

## 2015-03-31 LAB — SURGICAL PATHOLOGY

## 2015-04-02 ENCOUNTER — Encounter: Payer: Self-pay | Admitting: General Surgery

## 2015-04-02 ENCOUNTER — Ambulatory Visit (INDEPENDENT_AMBULATORY_CARE_PROVIDER_SITE_OTHER): Payer: Medicare Other | Admitting: General Surgery

## 2015-04-02 ENCOUNTER — Ambulatory Visit: Payer: Medicare Other

## 2015-04-02 VITALS — BP 126/74 | HR 78 | Resp 14 | Ht 62.0 in | Wt 136.0 lb

## 2015-04-02 DIAGNOSIS — D0511 Intraductal carcinoma in situ of right breast: Secondary | ICD-10-CM

## 2015-04-02 NOTE — Progress Notes (Signed)
Patient ID: Kristin Davies, female   DOB: [birth year], 69 y.o.   MRN: [id number]  Chief Complaint  Patient presents with  . Routine Post Op    HPI [name] is a 69 y.o. female here today for her post op right breast lumpectomy done on 03/26/15. Patient states she is doing well.  HPI  Past Medical History  Diagnosis Date  . Osteoporosis   . Arthritis   . Osteoarthritis   . SVT (supraventricular tachycardia) ([place]) 2012  . Trigeminal neuralgia   . PONV (postoperative nausea and vomiting)   . Cancer ([place]) 03/26/2015    DCIS, ER negative, PR negative, high-grade. 3.5 cm. 1 mm deep margin (pectoralis fascia).    Past Surgical History  Procedure Laterality Date  . Breast cyst aspiration Right 1985    Negative  . Breast biopsy Right 1985    Negative  . Breast biopsy Right 03/02/2015    DCIS  . Bunionectomy with hammertoe reconstruction Left 2012  . Hallux valgus correction  2013  . Vaginal hysterectomy  1978  . Cranioplasty  2014  . Colonoscopy  2013  . Breast fibroadenoma surgery Right 1982    [place]   . Breast lumpectomy Right 03/26/2015    Procedure: BREAST LUMPECTOMY;  Surgeon: Robert Bellow, MD;  Location: [facility] ORS;  Service: General;  Laterality: Right;    Family History  Problem Relation Age of Onset  . Breast cancer Paternal Aunt     mid 59's  . Alzheimer's disease Mother     Social History Social History  Substance Use Topics  . Smoking status: Never Smoker   . Smokeless tobacco: Never Used  . Alcohol Use: 0.0 oz/week    0 Standard drinks or equivalent per week     Comment: 1/day    No Known Allergies  Current Outpatient Prescriptions  Medication Sig Dispense Refill  . acetaminophen (TYLENOL) 500 MG tablet Take 500 mg by mouth every 6 (six) hours as needed.    Marland Kitchen alendronate (FOSAMAX) 70 MG tablet Take by mouth once a week.     . Aspirin-Acetaminophen-Caffeine (EXCEDRIN PO) Take 1 tablet by mouth 2 (two) times daily as needed.    .  Cholecalciferol (VITAMIN D3) 1000 UNITS CAPS Take by mouth.    . Garlic 123XX123 MG CAPS Take by mouth.    . Glucosamine-Chondroit-Vit C-Mn (GLUCOSAMINE CHONDR 1500 COMPLX) CAPS Take by mouth daily.     Marland Kitchen HYDROcodone-acetaminophen (NORCO) 5-325 MG tablet Take 1-2 tablets by mouth every 4 (four) hours as needed. 30 tablet 0  . metoprolol succinate (TOPROL-XL) 50 MG 24 hr tablet Take 50 mg by mouth daily.     . Multiple Vitamin (MULTIVITAMIN) capsule Take 1 capsule by mouth daily.    . Omega-3 Fatty Acids (OMEGA-3 FISH OIL PO) Take 1,500 mg by mouth.    . pregabalin (LYRICA) 100 MG capsule TAKE 1 CAPSULE BY MOUTH TWICE DAILY    . pseudoephedrine-acetaminophen (TYLENOL SINUS) 30-500 MG TABS tablet Take 1 tablet by mouth every 4 (four) hours as needed.    . vitamin C (ASCORBIC ACID) 500 MG tablet Take by mouth.     No current facility-administered medications for this visit.    Review of Systems Review of Systems  Constitutional: Negative.   Respiratory: Negative.   Cardiovascular: Negative.     Blood pressure 126/74, pulse 78, resp. rate 14, height 5\' 2"  (1.575 m), weight 136 lb (61.689 kg).  Physical Exam Physical Exam  Constitutional: She is  oriented to person, place, and time. She appears well-developed and well-nourished.  Eyes: Conjunctivae are normal. No scleral icterus.  Neck: Neck supple.  Cardiovascular: Normal rate and regular rhythm.   Pulmonary/Chest: Effort normal and breath sounds normal.    Lymphadenopathy:    She has no cervical adenopathy.  Neurological: She is alert and oriented to person, place, and time.  Skin: Skin is warm and dry.    Data Reviewed Ultrasound examination of the wide excision site was undertaken to determine if the patient is a candidate for accelerated partial breast radiation. There is a 1.8 x 2.9 x 4.0 cm seroma cavity a minimum of 1.47 cm below the skin in the upper-outer quadrant of the right breast.  Assessment    High-grade DCIS,  negative margins, ER/PR negative.    Plan    Candidate for partial or whole breast radiation. Procedures reviewed.    Patient to see Dr. Baruch Gouty at the [facility] to determine if she would be a possible mammosite candidate. Arrangements are in place for patient to see Dr. Baruch Gouty tomorrow, 04-03-15 at 11 am.   PCP:  Cephas Darby, [place] Gleason 04/03/2015, 6:54 AM

## 2015-04-03 ENCOUNTER — Ambulatory Visit: Payer: Medicare Other | Admitting: Radiation Oncology

## 2015-04-03 ENCOUNTER — Telehealth: Payer: Self-pay

## 2015-04-03 ENCOUNTER — Encounter: Payer: Self-pay | Admitting: General Surgery

## 2015-04-03 NOTE — Telephone Encounter (Signed)
  Oncology Nurse Navigator Documentation  Referral date to RadOnc/MedOnc: 04/07/15 (04/03/15 1100) Navigator Encounter Type: Treatment (Post-op) (04/03/15 1100) Patient Visit Type: Follow-up (04/03/15 1100) Treatment Phase: First Radiation Tx (Scheduled to see Dr. Baruch Gouty 04/07/15) (04/03/15 1100) Barriers/Navigation Needs: No barriers at this time;Education (04/03/15 1100) Education: Understanding Cancer/ Treatment Options (04/03/15 1100)              Time Spent with Patient: 30 (04/03/15 1100)  Phoned patient following post-op visit with Dr. Bary Castilla.  States she is scheduled to see Dr. Baruch Gouty for consult on 04/07/15 to discuss radiation.  She is hopeful for Mammosite treatment.  Plan to navigate with patient at that visit.

## 2015-04-06 NOTE — Anesthesia Postprocedure Evaluation (Signed)
  Anesthesia Post-op Note  Patient: Kristin Davies  Procedure(s) Performed: Procedure(s): BREAST LUMPECTOMY (Right)  Anesthesia type:General  Patient location: PACU  Post pain: Pain level controlled  Post assessment: Post-op Vital signs reviewed, Patient's Cardiovascular Status Stable, Respiratory Function Stable, Patent Airway and No signs of Nausea or vomiting  Post vital signs: Reviewed and stable  Last Vitals:  Filed Vitals:   03/26/15 1206  BP: 115/72  Pulse: 68  Temp:   Resp: 6    Level of consciousness: awake, alert  and patient cooperative  Complications: No apparent anesthesia complications

## 2015-04-07 ENCOUNTER — Ambulatory Visit
Admission: RE | Admit: 2015-04-07 | Discharge: 2015-04-07 | Disposition: A | Discharge status: Home / Self Care | Payer: Medicare Other | Source: Ambulatory Visit | Attending: Radiation Oncology | Admitting: Radiation Oncology

## 2015-04-07 ENCOUNTER — Encounter: Payer: Self-pay | Admitting: Radiation Oncology

## 2015-04-07 VITALS — BP 145/86 | HR 77 | Temp 95.7°F | Resp 18 | Wt 140.0 lb

## 2015-04-07 DIAGNOSIS — Z79899 Other long term (current) drug therapy: Secondary | ICD-10-CM | POA: Insufficient documentation

## 2015-04-07 DIAGNOSIS — Z803 Family history of malignant neoplasm of breast: Secondary | ICD-10-CM | POA: Insufficient documentation

## 2015-04-07 DIAGNOSIS — Z171 Estrogen receptor negative status [ER-]: Secondary | ICD-10-CM | POA: Insufficient documentation

## 2015-04-07 DIAGNOSIS — M810 Age-related osteoporosis without current pathological fracture: Secondary | ICD-10-CM | POA: Insufficient documentation

## 2015-04-07 DIAGNOSIS — M1990 Unspecified osteoarthritis, unspecified site: Secondary | ICD-10-CM | POA: Insufficient documentation

## 2015-04-07 DIAGNOSIS — D0511 Intraductal carcinoma in situ of right breast: Secondary | ICD-10-CM | POA: Insufficient documentation

## 2015-04-07 DIAGNOSIS — Z7982 Long term (current) use of aspirin: Secondary | ICD-10-CM | POA: Insufficient documentation

## 2015-04-07 DIAGNOSIS — I471 Supraventricular tachycardia: Secondary | ICD-10-CM | POA: Insufficient documentation

## 2015-04-07 DIAGNOSIS — Z510 Encounter for antineoplastic radiation therapy: Secondary | ICD-10-CM | POA: Insufficient documentation

## 2015-04-07 NOTE — Consult Note (Signed)
Except an outstanding is perfect of Radiation Oncology NEW PATIENT EVALUATION  Name: Kristin Davies  MRN: [id number]  Date:   04/07/2015     DOB: [birth year]   This 69 y.o. female patient presents to the clinic for initial evaluation of breast cancer stage 0 (Tis N0 M0) ER/PR negative status post wide local excision.Marland Kitchen  REFERRING PHYSICIAN: Kirk Ruths, MD  CHIEF COMPLAINT:  Chief Complaint  Patient presents with  . Breast Cancer    Pt is here for initial consultation of breast cancer for possible mammosite placement.      DIAGNOSIS: The encounter diagnosis was DCIS (ductal carcinoma in situ) of breast, right.   PREVIOUS INVESTIGATIONS:  Mammograms and ultrasound reviewed Clinical notes reviewed Surgical pathology report reviewed  HPI: Patient is a 69 year old female who presented with an abnormal mammogram performed 02/11/2015 showing an at 4 cm area of pleomorphic calcifications in the upper outer right breast. Stereotactic guided biopsy was performed showing high-grade comedo ductal carcinoma in situ with evolving sclerosing adenosis. She went on to have a wide local excision again showing residual high-grade comedo ductal carcinoma in situ ER/PR negative. Margins were clear but close at 1 mm. Tumor was nuclear grade high with comedonecrosis present. She has done well postoperatively. She specifically denies breast tenderness cough or bone pain. 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 She seen today for radiation oncology opinion.  PLANNED TREATMENT REGIMEN: Possible accelerated partial breast irradiation  PAST MEDICAL HISTORY:  has a past medical history of Osteoporosis; Arthritis; Osteoarthritis; SVT (supraventricular tachycardia) ([place]) (2012); Trigeminal neuralgia; PONV (postoperative nausea and vomiting); and Cancer ([place]) (03/26/2015).    PAST SURGICAL HISTORY:  Past Surgical History  Procedure Laterality Date  . Breast cyst aspiration Right 1985    Negative  .  Breast biopsy Right 1985    Negative  . Breast biopsy Right 03/02/2015    DCIS  . Bunionectomy with hammertoe reconstruction Left 2012  . Hallux valgus correction  2013  . Vaginal hysterectomy  1978  . Cranioplasty  2014  . Colonoscopy  2013  . Breast fibroadenoma surgery Right 1982    [place]   . Breast lumpectomy Right 03/26/2015    Procedure: BREAST LUMPECTOMY;  Surgeon: Robert Bellow, MD;  Location: [facility] ORS;  Service: General;  Laterality: Right;    FAMILY HISTORY: family history includes Alzheimer's disease in her mother; Breast cancer in her paternal aunt.  SOCIAL HISTORY:  reports that she has never smoked. She has never used smokeless tobacco. She reports that she drinks alcohol. She reports that she does not use illicit drugs.  ALLERGIES: Review of patient's allergies indicates no known allergies.  MEDICATIONS:  Current Outpatient Prescriptions  Medication Sig Dispense Refill  . acetaminophen (TYLENOL) 500 MG tablet Take 500 mg by mouth every 6 (six) hours as needed.    Marland Kitchen alendronate (FOSAMAX) 70 MG tablet Take by mouth once a week.     . Aspirin-Acetaminophen-Caffeine (EXCEDRIN PO) Take 1 tablet by mouth 2 (two) times daily as needed.    . Cholecalciferol (VITAMIN D3) 1000 UNITS CAPS Take by mouth.    . Garlic 123XX123 MG CAPS Take by mouth.    . Glucosamine-Chondroit-Vit C-Mn (GLUCOSAMINE CHONDR 1500 COMPLX) CAPS Take by mouth daily.     Marland Kitchen HYDROcodone-acetaminophen (NORCO) 5-325 MG tablet Take 1-2 tablets by mouth every 4 (four) hours as needed. 30 tablet 0  . metoprolol succinate (TOPROL-XL) 50 MG 24 hr tablet Take 50 mg by mouth daily.     Marland Kitchen  Multiple Vitamin (MULTIVITAMIN) capsule Take 1 capsule by mouth daily.    . Omega-3 Fatty Acids (OMEGA-3 FISH OIL PO) Take 1,500 mg by mouth.    . pregabalin (LYRICA) 100 MG capsule TAKE 1 CAPSULE BY MOUTH TWICE DAILY    . pseudoephedrine-acetaminophen (TYLENOL SINUS) 30-500 MG TABS tablet Take 1 tablet by mouth every 4 (four)  hours as needed.    . vitamin C (ASCORBIC ACID) 500 MG tablet Take by mouth.     No current facility-administered medications for this encounter.    ECOG PERFORMANCE STATUS:  0 - Asymptomatic  REVIEW OF SYSTEMS:  Patient denies any weight loss, fatigue, weakness, fever, chills or night sweats. Patient denies any loss of vision, blurred vision. Patient denies any ringing  of the ears or hearing loss. No irregular heartbeat. Patient denies heart murmur or history of fainting. Patient denies any chest pain or pain radiating to her upper extremities. Patient denies any shortness of breath, difficulty breathing at night, cough or hemoptysis. Patient denies any swelling in the lower legs. Patient denies any nausea vomiting, vomiting of blood, or coffee ground material in the vomitus. Patient denies any stomach pain. Patient states has had normal bowel movements no significant constipation or diarrhea. Patient denies any dysuria, hematuria or significant nocturia. Patient denies any problems walking, swelling in the joints or loss of balance. Patient denies any skin changes, loss of hair or loss of weight. Patient denies any excessive worrying or anxiety or significant depression. Patient denies any problems with insomnia. Patient denies excessive thirst, polyuria, polydipsia. Patient denies any swollen glands, patient denies easy bruising or easy bleeding. Patient denies any recent infections, allergies or URI. Patient "s visual fields have not changed significantly in recent time.    PHYSICAL EXAM: BP 145/86 mmHg  Pulse 77  Temp(Src) 95.7 F (35.4 C)  Resp 18  Wt 139 lb 15.9 oz (63.5 kg) A well-developed female in NAD. Right breast has a well-healed incisional scar. No dominant mass or nodularity is noted in either breast in 2 positions examined. No axillary or supra clavicular adenopathy is appreciated. Well-developed well-nourished patient in NAD. HEENT reveals PERLA, EOMI, discs not visualized.   Oral cavity is clear. No oral mucosal lesions are identified. Neck is clear without evidence of cervical or supraclavicular adenopathy. Lungs are clear to A&P. Cardiac examination is essentially unremarkable with regular rate and rhythm without murmur rub or thrill. Abdomen is benign with no organomegaly or masses noted. Motor sensory and DTR levels are equal and symmetric in the upper and lower extremities. Cranial nerves II through XII are grossly intact. Proprioception is intact. No peripheral adenopathy or edema is identified. No motor or sensory levels are noted. Crude visual fields are within normal range.  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 I would plan on delivering 3400 cGy in 10 fractions at 340 C twice a day through the MammoSite balloon catheter. Risks and benefits of treatment including possible small skin reaction, fatigue, permanent thickening of this lumpectomy site in her breast all were discussed in detail with the patient and her husband. I've also explained that if catheter could not be placed or is too close to skin or  chest wall we may have to go back and do whole breast radiation. Risks and benefits of both procedures again were explained in detail. Patient also will not be candidate for tamoxifen therapy based on the negative ER/PR status of her tumor. We will coordinate with surgeon's office on MammoSite balloon catheter placement as well as follow-up CT simulation.  I would like to take this opportunity for allowing me to participate in the care of your patient.Armstead Peaks., MD

## 2015-04-07 NOTE — Progress Notes (Signed)
  Oncology Nurse Navigator Documentation    Navigator Encounter Type: Initial RadOnc (04/07/15 1000) Patient Visit Type: Radonc;Initial (04/07/15 1000)   Barriers/Navigation Needs: Education (04/07/15 1000) Education: Understanding Cancer/ Treatment Options;Preparing for Upcoming Surgery/ Treatment (04/07/15 1000) Interventions: Education Method (04/07/15 1000)     Education Method: Verbal;Teach-back (Demonstration) (04/07/15 1000)      Time Spent with Patient: 30 (04/07/15 1000)   Met patient and husband at initial Radiation Oncology visit with Dr. Baruch Gouty.  Plans for Mammosite radiation treatment.  Dr. Baruch Gouty thoroughly explained, and demonstrated  use of Mammosite to treat breast cancer.  Dr. Dwyane Luo office to call patient to schedule placement of Mammosite catheter.

## 2015-04-08 ENCOUNTER — Telehealth: Payer: Self-pay | Admitting: *Deleted

## 2015-04-08 MED ORDER — CEFADROXIL 500 MG PO CAPS: 500.0000 mg | ORAL_CAPSULE | Freq: Two times a day (BID) | ORAL | Status: DC

## 2015-04-08 NOTE — Telephone Encounter (Signed)
Mammosite schedule reviewed with the patient Placement  04-28-15          at [facility] Scan 04-30-15 Treat Dec 9 and Dec 05-07-15 Aware Abigail Butts will be calling her for more details Aware of ATB and directions reviewed. Aware no showers and to wear her bra while mammosite in place.

## 2015-04-08 NOTE — Telephone Encounter (Signed)
Notified patient as instructed, patient pleased. Discussed follow-up appointments, patient agrees. Aware of [id number]

## 2015-04-14 NOTE — Anesthesia Postprocedure Evaluation (Signed)
Anesthesia Post Note  Patient: Kristin Davies  Procedure(s) Performed: Procedure(s) (LRB): BREAST LUMPECTOMY (Right)  Patient location during evaluation: PACU Anesthesia Type: General Level of consciousness: awake and alert Pain management: pain level controlled Vital Signs Assessment: post-procedure vital signs reviewed and stable Respiratory status: spontaneous breathing, nonlabored ventilation, respiratory function stable and patient connected to nasal cannula oxygen Cardiovascular status: blood pressure returned to baseline and stable Postop Assessment: No signs of nausea or vomiting Anesthetic complications: no    Last Vitals:  Filed Vitals:   03/26/15 1117 03/26/15 1206  BP: 128/71 115/72  Pulse: 66 68  Temp: 35.9 C   Resp: 16 6    Last Pain:  Filed Vitals:   03/26/15 1210  PainSc: 2                  Molli Barrows

## 2015-04-14 NOTE — Addendum Note (Signed)
Addendum  created 04/14/15 0856 by Molli Barrows, MD   Modules edited: Clinical Notes   Clinical Notes:  File: [id number]; [place]: [id number]

## 2015-04-28 ENCOUNTER — Ambulatory Visit: Payer: Medicare Other

## 2015-04-28 ENCOUNTER — Encounter: Payer: Self-pay | Admitting: General Surgery

## 2015-04-28 ENCOUNTER — Ambulatory Visit (INDEPENDENT_AMBULATORY_CARE_PROVIDER_SITE_OTHER): Payer: Medicare Other | Admitting: General Surgery

## 2015-04-28 VITALS — BP 130/68 | HR 76 | Resp 12 | Ht 62.0 in | Wt 140.0 lb

## 2015-04-28 DIAGNOSIS — D0512 Intraductal carcinoma in situ of left breast: Secondary | ICD-10-CM

## 2015-04-28 HISTORY — PX: BREAST MAMMOSITE: SHX5264

## 2015-04-28 NOTE — Patient Instructions (Signed)
Patient care kit given to patient.  Instructed no showers, sponge bath while mammosite in place, take antibiotic. Follow up with Cancer Center as arranged. Discussed wearing your bra for support at all times. 

## 2015-04-28 NOTE — Progress Notes (Signed)
Patient ID: Kristin Davies, female   DOB: 1945/06/15, 69 y.o.   MRN: [id number]  Chief Complaint  Patient presents with  . Procedure    mammosite    HPI Kristin Davies is a 69 y.o. female here today for a right mammosite placement. The patient has been evaluation by radiation oncology and felt to be a candidate for accelerated partial breast radiation. HPI  Past Medical History  Diagnosis Date  . Osteoporosis   . Arthritis   . Osteoarthritis   . SVT (supraventricular tachycardia) ([place]) 2012  . Trigeminal neuralgia   . PONV (postoperative nausea and vomiting)   . Cancer ([place]) 03/26/2015    DCIS, ER negative, PR negative, high-grade. 3.5 cm. 1 mm deep margin (pectoralis fascia).    Past Surgical History  Procedure Laterality Date  . Breast cyst aspiration Right 1985    Negative  . Breast biopsy Right 1985    Negative  . Breast biopsy Right 03/02/2015    DCIS  . Bunionectomy with hammertoe reconstruction Left 2012  . Hallux valgus correction  2013  . Vaginal hysterectomy  1978  . Cranioplasty  2014  . Colonoscopy  2013  . Breast fibroadenoma surgery Right 1982    [place]   . Breast lumpectomy Right 03/26/2015    Procedure: BREAST LUMPECTOMY;  Surgeon: Robert Bellow, MD;  Location: [facility] ORS;  Service: General;  Laterality: Right;  . Breast mammosite Right 04/28/2015    Family History  Problem Relation Age of Onset  . Breast cancer Paternal Aunt     mid 53's  . Alzheimer's disease Mother     Social History Social History  Substance Use Topics  . Smoking status: Never Smoker   . Smokeless tobacco: Never Used  . Alcohol Use: 0.0 oz/week    0 Standard drinks or equivalent per week     Comment: 1/day    No Known Allergies  Current Outpatient Prescriptions  Medication Sig Dispense Refill  . acetaminophen (TYLENOL) 500 MG tablet Take 500 mg by mouth every 6 (six) hours as needed.    Marland Kitchen alendronate (FOSAMAX) 70 MG tablet Take by mouth once a week.     .  Aspirin-Acetaminophen-Caffeine (EXCEDRIN PO) Take 1 tablet by mouth 2 (two) times daily as needed.    . cefadroxil (DURICEF) 500 MG capsule Take 1 capsule (500 mg total) by mouth 2 (two) times daily. Start one hour prior to office visit on 04-28-15 24 capsule 0  . Cholecalciferol (VITAMIN D3) 1000 UNITS CAPS Take by mouth.    . Garlic 123XX123 MG CAPS Take by mouth.    . Glucosamine-Chondroit-Vit C-Mn (GLUCOSAMINE CHONDR 1500 COMPLX) CAPS Take by mouth daily.     Marland Kitchen HYDROcodone-acetaminophen (NORCO) 5-325 MG tablet Take 1-2 tablets by mouth every 4 (four) hours as needed. 30 tablet 0  . metoprolol succinate (TOPROL-XL) 50 MG 24 hr tablet Take 50 mg by mouth daily.     . Multiple Vitamin (MULTIVITAMIN) capsule Take 1 capsule by mouth daily.    . Omega-3 Fatty Acids (OMEGA-3 FISH OIL PO) Take 1,500 mg by mouth.    . pregabalin (LYRICA) 100 MG capsule TAKE 1 CAPSULE BY MOUTH TWICE DAILY    . pseudoephedrine-acetaminophen (TYLENOL SINUS) 30-500 MG TABS tablet Take 1 tablet by mouth every 4 (four) hours as needed.    . vitamin C (ASCORBIC ACID) 500 MG tablet Take by mouth.     No current facility-administered medications for this visit.    Review of  Systems Review of Systems  Constitutional: Negative.   Respiratory: Negative.   Cardiovascular: Negative.     Blood pressure 130/68, pulse 76, resp. rate 12, height 5\' 2"  (1.575 m), weight 140 lb (63.504 kg).  Physical Exam Physical Exam Examination of the upper outer quadrant on the right breast shows excellent healing status post wide excision.  Data Reviewed Ultrasound examination showed a cavity deep in the breast. 10 mL of 0.5% Xylocaine with 0.25% Marcaine with 1-200,000 of epinephrine was utilized well tolerated. ChloraPrep was applied to the skin. The cavity evaluation device was placed and inflated to 35 cm. A 2.39 cm buffer was noted. The catheter does rest on the underlying pectoralis muscle without significant balloon deformity.  The 4-5  cm treatment balloon was placed and inflated with 35 cm of normal saline mixed with Omnipaque. A spherical balloon was noted. Distance between the skin and balloon was a minimum of 1.62 cm. Procedure was well tolerated. Bacitracin applied to the exit site followed by a fluff gauze dressing.  Assessment    Successful MammoSite placement.    Plan    The patient will undergo simulation on Thursday, December 8. We'll plan for follow-up examination in 2 weeks.     PCP:  Cephas Darby, [place] Gleason 04/29/2015, 5:24 PM

## 2015-04-29 ENCOUNTER — Encounter: Payer: Self-pay | Admitting: General Surgery

## 2015-04-30 ENCOUNTER — Ambulatory Visit
Admission: RE | Admit: 2015-04-30 | Discharge: 2015-04-30 | Disposition: A | Discharge status: Home / Self Care | Payer: Medicare Other | Source: Ambulatory Visit | Attending: Radiation Oncology | Admitting: Radiation Oncology

## 2015-04-30 ENCOUNTER — Ambulatory Visit: Admission: RE | Admit: 2015-04-30 | Payer: Medicare Other | Source: Ambulatory Visit

## 2015-04-30 DIAGNOSIS — M810 Age-related osteoporosis without current pathological fracture: Secondary | ICD-10-CM | POA: Diagnosis not present

## 2015-04-30 DIAGNOSIS — Z79899 Other long term (current) drug therapy: Secondary | ICD-10-CM | POA: Diagnosis not present

## 2015-04-30 DIAGNOSIS — M1990 Unspecified osteoarthritis, unspecified site: Secondary | ICD-10-CM | POA: Diagnosis not present

## 2015-04-30 DIAGNOSIS — I471 Supraventricular tachycardia: Secondary | ICD-10-CM | POA: Diagnosis not present

## 2015-04-30 DIAGNOSIS — Z510 Encounter for antineoplastic radiation therapy: Secondary | ICD-10-CM | POA: Diagnosis not present

## 2015-04-30 DIAGNOSIS — Z171 Estrogen receptor negative status [ER-]: Secondary | ICD-10-CM | POA: Diagnosis not present

## 2015-04-30 DIAGNOSIS — Z7982 Long term (current) use of aspirin: Secondary | ICD-10-CM | POA: Diagnosis not present

## 2015-04-30 DIAGNOSIS — D0511 Intraductal carcinoma in situ of right breast: Secondary | ICD-10-CM | POA: Diagnosis not present

## 2015-04-30 DIAGNOSIS — Z803 Family history of malignant neoplasm of breast: Secondary | ICD-10-CM | POA: Diagnosis not present

## 2015-05-01 ENCOUNTER — Ambulatory Visit
Admission: RE | Admit: 2015-05-01 | Discharge: 2015-05-01 | Disposition: A | Discharge status: Home / Self Care | Payer: Medicare Other | Source: Ambulatory Visit | Attending: Radiation Oncology | Admitting: Radiation Oncology

## 2015-05-01 DIAGNOSIS — D0511 Intraductal carcinoma in situ of right breast: Secondary | ICD-10-CM | POA: Diagnosis not present

## 2015-05-04 ENCOUNTER — Ambulatory Visit
Admission: RE | Admit: 2015-05-04 | Discharge: 2015-05-04 | Disposition: A | Discharge status: Home / Self Care | Payer: Medicare Other | Source: Ambulatory Visit | Attending: Radiation Oncology | Admitting: Radiation Oncology

## 2015-05-04 DIAGNOSIS — D0511 Intraductal carcinoma in situ of right breast: Secondary | ICD-10-CM | POA: Diagnosis not present

## 2015-05-05 ENCOUNTER — Ambulatory Visit
Admission: RE | Admit: 2015-05-05 | Discharge: 2015-05-05 | Disposition: A | Discharge status: Home / Self Care | Payer: Medicare Other | Source: Ambulatory Visit | Attending: Radiation Oncology | Admitting: Radiation Oncology

## 2015-05-05 DIAGNOSIS — D0511 Intraductal carcinoma in situ of right breast: Secondary | ICD-10-CM | POA: Diagnosis not present

## 2015-05-06 ENCOUNTER — Ambulatory Visit
Admission: RE | Admit: 2015-05-06 | Discharge: 2015-05-06 | Disposition: A | Discharge status: Home / Self Care | Payer: Medicare Other | Source: Ambulatory Visit | Attending: Radiation Oncology | Admitting: Radiation Oncology

## 2015-05-06 DIAGNOSIS — D0511 Intraductal carcinoma in situ of right breast: Secondary | ICD-10-CM | POA: Diagnosis not present

## 2015-05-07 ENCOUNTER — Ambulatory Visit
Admission: RE | Admit: 2015-05-07 | Discharge: 2015-05-07 | Disposition: A | Discharge status: Home / Self Care | Payer: Medicare Other | Source: Ambulatory Visit | Attending: Radiation Oncology | Admitting: Radiation Oncology

## 2015-05-07 DIAGNOSIS — D0511 Intraductal carcinoma in situ of right breast: Secondary | ICD-10-CM | POA: Diagnosis not present

## 2015-05-12 ENCOUNTER — Ambulatory Visit (INDEPENDENT_AMBULATORY_CARE_PROVIDER_SITE_OTHER): Payer: Medicare Other | Admitting: General Surgery

## 2015-05-12 ENCOUNTER — Encounter: Payer: Self-pay | Admitting: General Surgery

## 2015-05-12 VITALS — BP 130/74 | HR 78 | Resp 14 | Ht 62.0 in | Wt 137.0 lb

## 2015-05-12 DIAGNOSIS — D0511 Intraductal carcinoma in situ of right breast: Secondary | ICD-10-CM

## 2015-05-12 NOTE — Patient Instructions (Addendum)
The patient is aware to call back for any questions or concerns. Follow up in 5 months with an office visit.

## 2015-05-12 NOTE — Progress Notes (Signed)
Patient ID: Kristin Davies, female   DOB: Aug 28, 1945, 69 y.o.   MRN: [id number]  Chief Complaint  Patient presents with  . Follow-up    mammosite    HPI Kristin Davies is a 69 y.o. female here for a follow up from a right mammosite placement done on 04/28/15. The patient reports tolerating the procedure well.  I personally reviewed the patient's history. HPI  Past Medical History  Diagnosis Date  . Osteoporosis   . Arthritis   . Osteoarthritis   . SVT (supraventricular tachycardia) ([place]) 2012  . Trigeminal neuralgia   . PONV (postoperative nausea and vomiting)   . Cancer ([place]) 03/26/2015    DCIS, ER negative, PR negative, high-grade. 3.5 cm. 1 mm deep margin (pectoralis fascia).    Past Surgical History  Procedure Laterality Date  . Breast cyst aspiration Right 1985    Negative  . Breast biopsy Right 1985    Negative  . Breast biopsy Right 03/02/2015    DCIS  . Bunionectomy with hammertoe reconstruction Left 2012  . Hallux valgus correction  2013  . Vaginal hysterectomy  1978  . Cranioplasty  2014  . Colonoscopy  2013  . Breast fibroadenoma surgery Right 1982    [place]   . Breast lumpectomy Right 03/26/2015    Procedure: BREAST LUMPECTOMY;  Surgeon: Robert Bellow, MD;  Location: [facility] ORS;  Service: General;  Laterality: Right;  . Breast mammosite Right 04/28/2015    Family History  Problem Relation Age of Onset  . Breast cancer Paternal Aunt     mid 31's  . Alzheimer's disease Mother     Social History Social History  Substance Use Topics  . Smoking status: Never Smoker   . Smokeless tobacco: Never Used  . Alcohol Use: 0.0 oz/week    0 Standard drinks or equivalent per week     Comment: 1/day    No Known Allergies  Current Outpatient Prescriptions  Medication Sig Dispense Refill  . acetaminophen (TYLENOL) 500 MG tablet Take 500 mg by mouth every 6 (six) hours as needed.    Marland Kitchen alendronate (FOSAMAX) 70 MG tablet Take by mouth once a week.     .  Aspirin-Acetaminophen-Caffeine (EXCEDRIN PO) Take 1 tablet by mouth 2 (two) times daily as needed.    . cefadroxil (DURICEF) 500 MG capsule Take 1 capsule (500 mg total) by mouth 2 (two) times daily. Start one hour prior to office visit on 04-28-15 24 capsule 0  . Cholecalciferol (VITAMIN D3) 1000 UNITS CAPS Take by mouth.    . Garlic 123XX123 MG CAPS Take by mouth.    . Glucosamine-Chondroit-Vit C-Mn (GLUCOSAMINE CHONDR 1500 COMPLX) CAPS Take by mouth daily.     Marland Kitchen HYDROcodone-acetaminophen (NORCO) 5-325 MG tablet Take 1-2 tablets by mouth every 4 (four) hours as needed. 30 tablet 0  . metoprolol succinate (TOPROL-XL) 50 MG 24 hr tablet Take 50 mg by mouth daily.     . Multiple Vitamin (MULTIVITAMIN) capsule Take 1 capsule by mouth daily.    . Omega-3 Fatty Acids (OMEGA-3 FISH OIL PO) Take 1,500 mg by mouth.    . pregabalin (LYRICA) 100 MG capsule TAKE 1 CAPSULE BY MOUTH TWICE DAILY    . pseudoephedrine-acetaminophen (TYLENOL SINUS) 30-500 MG TABS tablet Take 1 tablet by mouth every 4 (four) hours as needed.    . vitamin C (ASCORBIC ACID) 500 MG tablet Take by mouth.     No current facility-administered medications for this visit.    Review  of Systems Review of Systems  Constitutional: Negative.   Respiratory: Negative.   Cardiovascular: Negative.     Blood pressure 130/74, pulse 78, resp. rate 14, height 5\' 2"  (1.575 m), weight 137 lb (62.143 kg).  Physical Exam Physical Exam  Constitutional: She is oriented to person, place, and time. She appears well-developed and well-nourished.  Pulmonary/Chest:    Right breast incision healing well.  Neurological: She is alert and oriented to person, place, and time.  Skin: Skin is warm and dry.  Psychiatric: Her behavior is normal.    Data Reviewed Radiation oncology notes.  Assessment    Doing well status post accelerated partial breast radiation. ER/PR negative tumor.    Plan        Follow up in 5 months with an office  visit.  This information has been scribed by Gaspar Cola CMA.  PCP: Vergia Alberts 05/13/2015, 4:11 PM

## 2015-05-28 NOTE — Addendum Note (Signed)
Encounter addended by: Wayne Both on: 05/28/2015  1:34 PM<BR>     Documentation filed: Charges VN

## 2015-06-10 ENCOUNTER — Ambulatory Visit
Admission: RE | Admit: 2015-06-10 | Discharge: 2015-06-10 | Disposition: A | Discharge status: Home / Self Care | Payer: Medicare Other | Source: Ambulatory Visit | Attending: Radiation Oncology | Admitting: Radiation Oncology

## 2015-06-10 ENCOUNTER — Encounter: Payer: Self-pay | Admitting: Radiation Oncology

## 2015-06-10 VITALS — BP 134/92 | HR 80 | Temp 96.7°F | Resp 18 | Wt 140.1 lb

## 2015-06-10 DIAGNOSIS — D0511 Intraductal carcinoma in situ of right breast: Secondary | ICD-10-CM

## 2015-06-10 NOTE — Progress Notes (Signed)
Radiation Oncology Follow up Note  Name: Kristin Davies   Date:   06/10/2015 MRN:  [id number] DOB: 1945-11-23    This 70 y.o. 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 She is seen today in routine follow-up and is doing well she specifically denies breast tenderness cough or bone pain. Based on ER/PR negative nature of her tumor she is not on tamoxifen therapy..  COMPLICATIONS OF TREATMENT: none  FOLLOW UP COMPLIANCE: keeps appointments   PHYSICAL EXAM:  BP 134/92 mmHg  Pulse 80  Temp(Src) 96.7 F (35.9 C)  Resp 18  Wt 140 lb 1.6 oz (63.55 kg) Lungs are clear to A&P cardiac examination essentially unremarkable with regular rate and rhythm. No dominant mass or nodularity is noted in either breast in 2 positions examined. Incision is well-healed. No axillary or supraclavicular adenopathy is appreciated. Cosmetic result is excellent. Well-developed well-nourished patient in NAD. HEENT reveals PERLA, EOMI, discs not visualized.  Oral cavity is clear. No oral mucosal lesions are identified. Neck is clear without evidence of cervical or supraclavicular adenopathy. Lungs are clear to A&P. Cardiac examination is essentially unremarkable with regular rate and rhythm without murmur rub or thrill. Abdomen is benign with no organomegaly or masses noted. Motor sensory and DTR levels are equal and symmetric in the upper and lower extremities. Cranial nerves II through XII are grossly intact. Proprioception is intact. No peripheral adenopathy or edema is identified. No motor or sensory levels are noted. Crude visual fields are within normal  range.  RADIOLOGY RESULTS: No current films for review  PLAN: Present time patient is doing well with no evidence of disease 1 month out from accelerated partial breast radiation. I'm please were overall progress. Will not be on tamoxifen therapy based on ER/PR negative nature of her tumor. I've asked to see her back in 4-5 months for follow-up. She continues close follow-up care with surgeon.  I would like to take this opportunity for allowing me to participate in the care of your patient.Armstead Peaks., MD

## 2015-07-07 ENCOUNTER — Encounter: Payer: Self-pay | Admitting: *Deleted

## 2015-07-09 ENCOUNTER — Ambulatory Visit: Payer: Medicare Other | Admitting: General Surgery

## 2015-07-09 ENCOUNTER — Ambulatory Visit (INDEPENDENT_AMBULATORY_CARE_PROVIDER_SITE_OTHER): Payer: Medicare Other | Admitting: General Surgery

## 2015-07-09 ENCOUNTER — Encounter: Payer: Self-pay | Admitting: General Surgery

## 2015-07-09 VITALS — BP 146/82 | HR 80 | Resp 12 | Ht 62.0 in | Wt 141.0 lb

## 2015-07-09 DIAGNOSIS — D0511 Intraductal carcinoma in situ of right breast: Secondary | ICD-10-CM | POA: Diagnosis not present

## 2015-07-09 NOTE — Patient Instructions (Signed)
Continue self breast exams. Call office for any new breast issues or concerns. 

## 2015-07-09 NOTE — Progress Notes (Signed)
Patient ID: Kristin Davies, female   DOB: [birth year], 70 y.o.   MRN: [id number]  Chief Complaint  Patient presents with  . Other    right breast skin changes    HPI Kristin Davies is a 70 y.o. female here today for right breast skin changes. She noticed the redness/pink about 2 weeks ago when getting out of the shower. Since that time she feels the area has become a little more prominent. No real tenderness in the area of erythema. She feels it may be radiation changes but she wants to make sure. She does admit to the right breast being a little "sore" in the area superior to the wide excision.  I personally reviewed the patient's history.   HPI  Past Medical History  Diagnosis Date  . Osteoporosis   . Arthritis   . Osteoarthritis   . SVT (supraventricular tachycardia) ([place]) 2012  . Trigeminal neuralgia   . PONV (postoperative nausea and vomiting)   . Cancer ([place]) 03/26/2015    DCIS, ER negative, PR negative, high-grade. 3.5 cm. 1 mm deep margin (pectoralis fascia).    Past Surgical History  Procedure Laterality Date  . Breast cyst aspiration Right 1985    Negative  . Breast biopsy Right 1985    Negative  . Breast biopsy Right 03/02/2015    DCIS  . Bunionectomy with hammertoe reconstruction Left 2012  . Hallux valgus correction  2013  . Vaginal hysterectomy  1978  . Cranioplasty  2014  . Colonoscopy  2013  . Breast fibroadenoma surgery Right 1982    [place]   . Breast lumpectomy Right 03/26/2015    Procedure: BREAST LUMPECTOMY;  Surgeon: Robert Bellow, MD;  Location: [facility] ORS;  Service: General;  Laterality: Right;  . Breast mammosite Right 04/28/2015    Family History  Problem Relation Age of Onset  . Breast cancer Paternal Aunt     mid 68's  . Alzheimer's disease Mother     Social History Social History  Substance Use Topics  . Smoking status: Never Smoker   . Smokeless tobacco: Never Used  . Alcohol Use: 0.0 oz/week    0 Standard drinks or equivalent  per week     Comment: 1/day    No Known Allergies  Current Outpatient Prescriptions  Medication Sig Dispense Refill  . acetaminophen (TYLENOL) 500 MG tablet Take 500 mg by mouth every 6 (six) hours as needed.    Marland Kitchen alendronate (FOSAMAX) 70 MG tablet Take by mouth once a week.     . Aspirin-Acetaminophen-Caffeine (EXCEDRIN PO) Take 1 tablet by mouth 2 (two) times daily as needed.    . Cholecalciferol (VITAMIN D3) 1000 UNITS CAPS Take by mouth.    . Garlic 123XX123 MG CAPS Take by mouth.    . Glucosamine-Chondroit-Vit C-Mn (GLUCOSAMINE CHONDR 1500 COMPLX) CAPS Take by mouth daily.     . metoprolol succinate (TOPROL-XL) 50 MG 24 hr tablet Take 50 mg by mouth daily.     . Multiple Vitamin (MULTIVITAMIN) capsule Take 1 capsule by mouth daily.    . Omega-3 Fatty Acids (OMEGA-3 FISH OIL PO) Take 1,500 mg by mouth.    . pseudoephedrine-acetaminophen (TYLENOL SINUS) 30-500 MG TABS tablet Take 1 tablet by mouth every 4 (four) hours as needed.    . vitamin C (ASCORBIC ACID) 500 MG tablet Take by mouth.     No current facility-administered medications for this visit.    Review of Systems Review of Systems  Constitutional: Negative.  Respiratory: Negative.   Cardiovascular: Negative.     Blood pressure 146/82, pulse 80, resp. rate 12, height 5\' 2"  (1.575 m), weight 141 lb (63.957 kg).  Physical Exam Physical Exam  Constitutional: She is oriented to person, place, and time. She appears well-developed and well-nourished.  Pulmonary/Chest: Right breast exhibits skin change. Right breast exhibits no inverted nipple, no mass, no nipple discharge and no tenderness. Left breast exhibits no inverted nipple, no mass, no nipple discharge, no skin change and no tenderness.    Faint discoloration 4-5 cm at 2-4 o'clock right breast. Well healed incision upper outer quadrant right breast, mammosite area is clean.  Neurological: She is alert and oriented to person, place, and time.  Skin: Skin is warm and  dry.  Psychiatric: Her behavior is normal.     Assessment    Indeterminant breast exam, no indication for intervention at present.    Plan    We will plan for follow-up exam as originally scheduled later in spring, the patient's aware she may call earlier if there is a significant change in the clinical appearance of the breast.     Follow up as scheduled.  PCP:  Frazier Richards This information has been scribed by Karie Fetch [place].     Robert Bellow 07/10/2015, 8:57 AM

## 2015-08-20 DIAGNOSIS — M818 Other osteoporosis without current pathological fracture: Secondary | ICD-10-CM | POA: Insufficient documentation

## 2015-08-20 DIAGNOSIS — Z853 Personal history of malignant neoplasm of breast: Secondary | ICD-10-CM | POA: Insufficient documentation

## 2015-10-12 ENCOUNTER — Ambulatory Visit: Payer: Self-pay | Admitting: General Surgery

## 2015-10-22 ENCOUNTER — Encounter: Payer: Self-pay | Admitting: General Surgery

## 2015-10-22 ENCOUNTER — Ambulatory Visit (INDEPENDENT_AMBULATORY_CARE_PROVIDER_SITE_OTHER): Payer: Medicare Other | Admitting: General Surgery

## 2015-10-22 VITALS — BP 138/72 | HR 76 | Resp 14 | Ht 62.0 in | Wt 138.0 lb

## 2015-10-22 DIAGNOSIS — D0511 Intraductal carcinoma in situ of right breast: Secondary | ICD-10-CM

## 2015-10-22 NOTE — Patient Instructions (Addendum)
The patient is aware to call back for any questions or concerns. Return in three months bilateral diagnotic mammogram.

## 2015-10-22 NOTE — Progress Notes (Signed)
Patient ID: Kristin Davies, female   DOB: 18-Jan-1946, 70 y.o.   MRN: [id number]  Chief Complaint  Patient presents with  . Follow-up    breast cancer    HPI Kristin Davies is a 70 y.o. female.  Here today for follow up right breast cancer. She states the redness and soreness has Resolved since her last visit.  I personally reviewed the patient's history.  HPI  Past Medical History  Diagnosis Date  . Osteoporosis   . Arthritis   . Osteoarthritis   . SVT (supraventricular tachycardia) ([place]) 2012  . Trigeminal neuralgia   . PONV (postoperative nausea and vomiting)   . Cancer ([place]) 03/26/2015    DCIS, ER negative, PR negative, high-grade. 3.5 cm. 1 mm deep margin (pectoralis fascia).    Past Surgical History  Procedure Laterality Date  . Breast cyst aspiration Right 1985    Negative  . Breast biopsy Right 1985    Negative  . Breast biopsy Right 03/02/2015    DCIS  . Bunionectomy with hammertoe reconstruction Left 2012  . Hallux valgus correction  2013  . Vaginal hysterectomy  1978  . Cranioplasty  2014  . Colonoscopy  2013  . Breast fibroadenoma surgery Right 1982    [place]   . Breast lumpectomy Right 03/26/2015    Procedure: BREAST LUMPECTOMY;  Surgeon: Robert Bellow, MD;  Location: [facility] ORS;  Service: General;  Laterality: Right;  . Breast mammosite Right 04/28/2015    Family History  Problem Relation Age of Onset  . Breast cancer Paternal Aunt     mid 31's  . Alzheimer's disease Mother     Social History Social History  Substance Use Topics  . Smoking status: Never Smoker   . Smokeless tobacco: Never Used  . Alcohol Use: 0.0 oz/week    0 Standard drinks or equivalent per week     Comment: 1/day    No Known Allergies  Current Outpatient Prescriptions  Medication Sig Dispense Refill  . acetaminophen (TYLENOL) 500 MG tablet Take 500 mg by mouth every 6 (six) hours as needed.    . Aspirin-Acetaminophen-Caffeine (EXCEDRIN PO) Take 1 tablet by mouth 2  (two) times daily as needed.    . Cholecalciferol (VITAMIN D3) 1000 UNITS CAPS Take by mouth.    . Garlic 123XX123 MG CAPS Take by mouth.    . Glucosamine-Chondroit-Vit C-Mn (GLUCOSAMINE CHONDR 1500 COMPLX) CAPS Take by mouth daily.     . metoprolol succinate (TOPROL-XL) 50 MG 24 hr tablet Take 50 mg by mouth daily.     . Multiple Vitamin (MULTIVITAMIN) capsule Take 1 capsule by mouth daily.    . Omega-3 Fatty Acids (OMEGA-3 FISH OIL PO) Take 1,500 mg by mouth.    . pseudoephedrine-acetaminophen (TYLENOL SINUS) 30-500 MG TABS tablet Take 1 tablet by mouth every 4 (four) hours as needed.    . vitamin C (ASCORBIC ACID) 500 MG tablet Take by mouth.     No current facility-administered medications for this visit.    Review of Systems Review of Systems  Constitutional: Negative.   Respiratory: Negative.   Cardiovascular: Negative.     Blood pressure 138/72, pulse 76, resp. rate 14, height 5\' 2"  (1.575 m), weight 138 lb (62.596 kg).  Physical Exam Physical Exam  Constitutional: She is oriented to person, place, and time. She appears well-developed and well-nourished.  Eyes: Conjunctivae are normal. No scleral icterus.  Neck: Neck supple.  Cardiovascular: Normal rate, regular rhythm and normal heart sounds.  Pulmonary/Chest: Effort normal and breath sounds normal. Right breast exhibits no inverted nipple, no mass, no nipple discharge, no skin change and no tenderness. Left breast exhibits no inverted nipple, no mass, no nipple discharge, no skin change and no tenderness.    Right breat well healed incision at 10 clock.   Abdominal: Soft. Bowel sounds are normal.  Lymphadenopathy:    She has no cervical adenopathy.    She has no axillary adenopathy.  Neurological: She is alert and oriented to person, place, and time.  Skin: Skin is warm and dry.    Data Reviewed None.  Assessment    Doing well status post wide excision, accelerated partial breast radiation for high-grade DCIS.     Plan       Return in three months bilateral diagnotic mammogram. PCP:  Frazier Richards This information has been scribed by Karie Fetch RN, BSN,BC.    Robert Bellow 10/23/2015, 12:23 PM

## 2015-12-11 ENCOUNTER — Ambulatory Visit: Payer: Medicare Other | Admitting: Radiation Oncology

## 2015-12-16 ENCOUNTER — Other Ambulatory Visit: Payer: Self-pay | Admitting: General Surgery

## 2015-12-16 DIAGNOSIS — D0511 Intraductal carcinoma in situ of right breast: Secondary | ICD-10-CM

## 2015-12-17 ENCOUNTER — Ambulatory Visit
Admission: RE | Admit: 2015-12-17 | Discharge: 2015-12-17 | Disposition: A | Discharge status: Home / Self Care | Payer: Medicare Other | Source: Ambulatory Visit | Attending: Radiation Oncology | Admitting: Radiation Oncology

## 2015-12-17 ENCOUNTER — Encounter: Payer: Self-pay | Admitting: Radiation Oncology

## 2015-12-17 VITALS — BP 145/88 | HR 71 | Temp 96.2°F | Resp 18 | Wt 140.0 lb

## 2015-12-17 DIAGNOSIS — Z171 Estrogen receptor negative status [ER-]: Secondary | ICD-10-CM | POA: Diagnosis not present

## 2015-12-17 DIAGNOSIS — D0511 Intraductal carcinoma in situ of right breast: Secondary | ICD-10-CM | POA: Diagnosis not present

## 2015-12-17 DIAGNOSIS — Z923 Personal history of irradiation: Secondary | ICD-10-CM | POA: Diagnosis not present

## 2015-12-17 NOTE — Progress Notes (Signed)
Radiation Oncology Follow up Note  Name: Kristin Davies   Date:   12/17/2015 MRN:  [id number] DOB: [birth year]    This 70 y.o. female presents to the clinic today for seventh month follow-up status post accelerated partial breast irradiation to her right breast for ductal carcinoma in situ.  REFERRING PROVIDER: Kirk Ruths, MD  HPI: Patient is a 70 year old female now seen out 7 months having completed accelerated partial breast irradiation to her right breast for ER/PR negative ductal carcinoma in situ. She seen today in routine follow-up and is doing well. She specifically denies breast tenderness cough or bone pain.. She is not on tamoxifen based on ER/PR negative nature of her disease. She is scheduled in the next several months for follow-up mammogram.  COMPLICATIONS OF TREATMENT: none  FOLLOW UP COMPLIANCE: keeps appointments   PHYSICAL EXAM:  BP (!) 145/88 (BP Location: Left Arm, Patient Position: Sitting, Cuff Size: Normal)   Pulse 71   Temp (!) 96.2 F (35.7 C)   Resp 18   Wt 139 lb 15.9 oz (63.5 kg)   BMI 25.60 kg/m  Lungs are clear to A&P cardiac examination essentially unremarkable with regular rate and rhythm. No dominant mass or nodularity is noted in either breast in 2 positions examined. Incision is well-healed. No axillary or supraclavicular adenopathy is appreciated. Cosmetic result is excellent. Well-developed well-nourished patient in NAD. HEENT reveals PERLA, EOMI, discs not visualized.  Oral cavity is clear. No oral mucosal lesions are identified. Neck is clear without evidence of cervical or supraclavicular adenopathy. Lungs are clear to A&P. Cardiac examination is essentially unremarkable with regular rate and rhythm without murmur rub or thrill. Abdomen is benign with no organomegaly or masses noted. Motor sensory and DTR levels are equal and symmetric in the upper and lower extremities. Cranial nerves II through XII are grossly intact. Proprioception is  intact. No peripheral adenopathy or edema is identified. No motor or sensory levels are noted. Crude visual fields are within normal range.  RADIOLOGY RESULTS: Mammogram in the next several months will be reviewed when available  PLAN: Present time she is doing extremely well without side effect excellent cosmetic result. I'm please were overall progress. I've asked to see her back in 1 year for follow-up.    Armstead Peaks., MD

## 2015-12-29 ENCOUNTER — Encounter: Payer: Self-pay | Admitting: *Deleted

## 2016-02-12 ENCOUNTER — Ambulatory Visit
Admission: RE | Admit: 2016-02-12 | Discharge: 2016-02-12 | Disposition: A | Discharge status: Home / Self Care | Payer: Medicare Other | Source: Ambulatory Visit | Attending: General Surgery | Admitting: General Surgery

## 2016-02-12 ENCOUNTER — Other Ambulatory Visit: Payer: Self-pay | Admitting: General Surgery

## 2016-02-12 DIAGNOSIS — D0511 Intraductal carcinoma in situ of right breast: Secondary | ICD-10-CM

## 2016-02-12 DIAGNOSIS — Z9889 Other specified postprocedural states: Secondary | ICD-10-CM | POA: Insufficient documentation

## 2016-02-12 HISTORY — DX: Malignant neoplasm of unspecified site of unspecified female breast: C50.919

## 2016-02-12 HISTORY — DX: Personal history of irradiation: Z92.3

## 2016-02-18 ENCOUNTER — Ambulatory Visit (INDEPENDENT_AMBULATORY_CARE_PROVIDER_SITE_OTHER): Payer: Medicare Other | Admitting: General Surgery

## 2016-02-18 ENCOUNTER — Encounter: Payer: Self-pay | Admitting: General Surgery

## 2016-02-18 ENCOUNTER — Ambulatory Visit: Payer: Medicare Other | Admitting: General Surgery

## 2016-02-18 VITALS — BP 126/72 | HR 74 | Resp 12 | Ht 61.0 in | Wt 139.0 lb

## 2016-02-18 DIAGNOSIS — D0512 Intraductal carcinoma in situ of left breast: Secondary | ICD-10-CM

## 2016-02-18 DIAGNOSIS — D0511 Intraductal carcinoma in situ of right breast: Secondary | ICD-10-CM | POA: Diagnosis not present

## 2016-02-18 NOTE — Progress Notes (Signed)
Patient ID: Kristin Davies, female   DOB: [birth year], 70 y.o.   MRN: [id number]  Chief Complaint  Patient presents with  . Follow-up    mammogram    HPI Kristin Davies is a 70 y.o. female who presents for a breast evaluation. The most recent mammogram was done on 02/12/16 .  Patient does perform regular self breast checks and gets regular mammograms done.    HPI  Past Medical History:  Diagnosis Date  . Arthritis   . Breast cancer ([place]) 2016   RT LUMPECTOMY  . Cancer ([place]) 03/26/2015   DCIS, ER negative, PR negative, high-grade. 3.5 cm. 1 mm deep margin (pectoralis fascia).  . Osteoarthritis   . Osteoporosis   . Personal history of radiation therapy 2016   BREAST CA  . PONV (postoperative nausea and vomiting)   . SVT (supraventricular tachycardia) ([place]) 2012  . Trigeminal neuralgia     Past Surgical History:  Procedure Laterality Date  . BREAST BIOPSY Right 1985   Negative  . BREAST BIOPSY Right 03/02/2015   DCIS  . BREAST CYST ASPIRATION Right 1985   Negative  . [place]   . BREAST LUMPECTOMY Right 03/26/2015   Procedure: BREAST LUMPECTOMY;  Surgeon: Robert Bellow, MD;  Location: [facility] ORS;  Service: General;  Laterality: Right;  . BREAST MAMMOSITE Right 04/28/2015  . BUNIONECTOMY WITH HAMMERTOE RECONSTRUCTION Left 2012  . COLONOSCOPY  2013  . CRANIOPLASTY  2014  . [place]  2013  . VAGINAL HYSTERECTOMY  1978    Family History  Problem Relation Age of Onset  . Alzheimer's disease Mother   . Breast cancer Paternal Aunt     mid 76's    Social History Social History  Substance Use Topics  . Smoking status: Never Smoker  . Smokeless tobacco: Never Used  . Alcohol use 0.0 oz/week     Comment: 1/day    No Known Allergies  Current Outpatient Prescriptions  Medication Sig Dispense Refill  . acetaminophen (TYLENOL) 500 MG tablet Take 500 mg by mouth every 6 (six) hours as needed.    Marland Kitchen alendronate  (FOSAMAX) 70 MG tablet     . Aspirin-Acetaminophen-Caffeine (EXCEDRIN PO) Take 1 tablet by mouth 2 (two) times daily as needed.    . Cholecalciferol (VITAMIN D3) 1000 UNITS CAPS Take by mouth.    . Garlic 123XX123 MG CAPS Take by mouth.    . Glucosamine-Chondroit-Vit C-Mn (GLUCOSAMINE CHONDR 1500 COMPLX) CAPS Take by mouth daily.     . metoprolol succinate (TOPROL-XL) 50 MG 24 hr tablet Take 50 mg by mouth daily.     . Multiple Vitamin (MULTIVITAMIN) capsule Take 1 capsule by mouth daily.    . Omega-3 Fatty Acids (OMEGA-3 FISH OIL PO) Take 1,500 mg by mouth.    . pseudoephedrine-acetaminophen (TYLENOL SINUS) 30-500 MG TABS tablet Take 1 tablet by mouth every 4 (four) hours as needed.    . vitamin C (ASCORBIC ACID) 500 MG tablet Take by mouth.     No current facility-administered medications for this visit.     Review of Systems Review of Systems  Constitutional: Negative.   Respiratory: Negative.   Cardiovascular: Negative.     Blood pressure 126/72, pulse 74, resp. rate 12, height 5\' 1"  (1.549 m), weight 139 lb (63 kg).  Physical Exam Physical Exam  Constitutional: She is oriented to person, place, and time. She appears well-developed and well-nourished.  Eyes: Conjunctivae are normal. No  scleral icterus.  Neck: Neck supple.  Cardiovascular: Normal rate, regular rhythm and normal heart sounds.   Pulmonary/Chest: Effort normal and breath sounds normal. Right breast exhibits no inverted nipple, no mass, no nipple discharge, no skin change and no tenderness. Left breast exhibits no inverted nipple, no mass, no nipple discharge, no skin change and no tenderness.    Abdominal: Soft. Normal appearance and bowel sounds are normal. There is no hepatomegaly. There is no tenderness.  Lymphadenopathy:    She has no cervical adenopathy.    She has no axillary adenopathy.  Neurological: She is alert and oriented to person, place, and time.  Skin: Skin is warm and dry.    Data  Reviewed Bilateral diagnostic mammograms dated 02/12/2016 were reviewed. Architectural distortion in the upper outer quadrant post surgery were noted. BI-RADS-2.  Assessment     Stable breast exam post wide excision.    Plan        Patient to rerturn in one year bilateral diagnotic mammogram. This information has been scribed by Gaspar Cola CMA.   Robert Bellow 02/18/2016, 8:36 PM

## 2016-02-18 NOTE — Patient Instructions (Signed)
Patient to rerturn in one year bilateral diagnotic mammogram.

## 2016-03-17 ENCOUNTER — Other Ambulatory Visit: Payer: Self-pay | Admitting: Neurology

## 2016-03-17 DIAGNOSIS — G500 Trigeminal neuralgia: Secondary | ICD-10-CM

## 2016-03-26 DIAGNOSIS — D329 Benign neoplasm of meninges, unspecified: Secondary | ICD-10-CM | POA: Insufficient documentation

## 2016-04-12 ENCOUNTER — Ambulatory Visit
Admission: RE | Admit: 2016-04-12 | Discharge: 2016-04-12 | Disposition: A | Discharge status: Home / Self Care | Payer: Medicare Other | Source: Ambulatory Visit | Attending: Neurology | Admitting: Neurology

## 2016-04-12 DIAGNOSIS — G500 Trigeminal neuralgia: Secondary | ICD-10-CM | POA: Diagnosis not present

## 2016-04-12 DIAGNOSIS — D320 Benign neoplasm of cerebral meninges: Secondary | ICD-10-CM | POA: Insufficient documentation

## 2016-04-12 LAB — POCT I-STAT CREATININE: CREATININE: 0.8 mg/dL (ref 0.44–1.00)

## 2016-04-12 MED ORDER — GADOBENATE DIMEGLUMINE 529 MG/ML IV SOLN
12.0000 mL | Freq: Once | INTRAVENOUS | Status: AC | PRN
  Administered 2016-04-12: 15 mL via INTRAVENOUS

## 2016-09-05 ENCOUNTER — Other Ambulatory Visit: Payer: Self-pay | Admitting: Neurology

## 2016-09-05 DIAGNOSIS — D320 Benign neoplasm of cerebral meninges: Secondary | ICD-10-CM

## 2016-10-10 ENCOUNTER — Ambulatory Visit: Payer: Medicare Other

## 2016-10-11 ENCOUNTER — Ambulatory Visit
Admission: RE | Admit: 2016-10-11 | Discharge: 2016-10-11 | Disposition: A | Discharge status: Home / Self Care | Payer: Medicare Other | Source: Ambulatory Visit | Attending: Neurology | Admitting: Neurology

## 2016-10-11 DIAGNOSIS — D320 Benign neoplasm of cerebral meninges: Secondary | ICD-10-CM | POA: Diagnosis not present

## 2016-10-11 LAB — POCT I-STAT CREATININE: Creatinine, Ser: 0.9 mg/dL (ref 0.44–1.00)

## 2016-10-11 MED ORDER — GADOBENATE DIMEGLUMINE 529 MG/ML IV SOLN
12.0000 mL | Freq: Once | INTRAVENOUS | Status: AC | PRN
  Administered 2016-10-11: 12 mL via INTRAVENOUS

## 2016-12-12 ENCOUNTER — Other Ambulatory Visit: Payer: Self-pay

## 2016-12-12 DIAGNOSIS — D0511 Intraductal carcinoma in situ of right breast: Secondary | ICD-10-CM

## 2016-12-15 ENCOUNTER — Encounter: Payer: Self-pay | Admitting: Radiation Oncology

## 2016-12-15 ENCOUNTER — Ambulatory Visit
Admission: RE | Admit: 2016-12-15 | Discharge: 2016-12-15 | Disposition: A | Discharge status: Home / Self Care | Payer: Medicare Other | Source: Ambulatory Visit | Attending: Radiation Oncology | Admitting: Radiation Oncology

## 2016-12-15 VITALS — BP 134/79 | HR 75 | Temp 98.1°F | Resp 20 | Wt 137.7 lb

## 2016-12-15 DIAGNOSIS — Z923 Personal history of irradiation: Secondary | ICD-10-CM | POA: Diagnosis not present

## 2016-12-15 DIAGNOSIS — Z86000 Personal history of in-situ neoplasm of breast: Secondary | ICD-10-CM | POA: Insufficient documentation

## 2016-12-15 DIAGNOSIS — D0511 Intraductal carcinoma in situ of right breast: Secondary | ICD-10-CM

## 2016-12-15 NOTE — Progress Notes (Signed)
Radiation Oncology Follow up Note  Name: Kristin Davies   Date:   12/15/2016 MRN:  [id number] DOB: 1945-10-29    This 71 y.o. female presents to the clinic today for 18 month follow-up status post accelerated partial breast irradiation. 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 Seen today in routine follow-up she is doing well. Her last mammogram was back in September BI-RADS 2 benign. She is not on tamoxifen based on ER/PR negative. She specifically denies breast tenderness cough or bone pain..  COMPLICATIONS OF TREATMENT: none  FOLLOW UP COMPLIANCE: keeps appointments   PHYSICAL EXAM:  BP 134/79   Pulse 75   Temp 98.1 F (36.7 C)   Resp 20   Wt 137 lb 10.8 oz (62.5 kg)   BMI 26.01 kg/m  Lungs are clear to A&P cardiac examination essentially unremarkable with regular rate and rhythm. No dominant mass or nodularity is noted in either breast in 2 positions examined. Incision is well-healed. No axillary or supraclavicular adenopathy is appreciated. Cosmetic result is excellent. Well-developed well-nourished patient in NAD. HEENT reveals PERLA, EOMI, discs not visualized.  Oral cavity is clear. No oral mucosal lesions are identified. Neck is clear without evidence of cervical or supraclavicular adenopathy. Lungs are clear to A&P. Cardiac examination is essentially unremarkable with regular rate and rhythm without murmur rub or thrill. Abdomen is benign with no organomegaly or masses noted. Motor sensory and DTR levels are equal and symmetric in the upper and lower extremities. Cranial nerves II through XII are grossly intact. Proprioception is intact. No peripheral adenopathy or edema is identified. No motor or sensory levels are noted. Crude visual fields are within normal  range.  RADIOLOGY RESULTS: Previous mammograms are reviewed compatible with the above-stated findings  PLAN: At the present time patient is doing well with no evidence of disease. I'm please were overall progress. I've asked to see her back in 1 year for follow-up. She's or rescheduled for follow-up mammograms in September. Patient knows to call with any concerns.  I would like to take this opportunity to thank you for allowing me to participate in the care of your patient.Armstead Peaks., MD

## 2017-02-14 ENCOUNTER — Ambulatory Visit
Admission: RE | Admit: 2017-02-14 | Discharge: 2017-02-14 | Disposition: A | Discharge status: Home / Self Care | Payer: Medicare Other | Source: Ambulatory Visit | Attending: General Surgery | Admitting: General Surgery

## 2017-02-14 DIAGNOSIS — D0511 Intraductal carcinoma in situ of right breast: Secondary | ICD-10-CM | POA: Diagnosis present

## 2017-02-22 ENCOUNTER — Ambulatory Visit (INDEPENDENT_AMBULATORY_CARE_PROVIDER_SITE_OTHER): Payer: Medicare Other | Admitting: General Surgery

## 2017-02-22 ENCOUNTER — Encounter: Payer: Self-pay | Admitting: General Surgery

## 2017-02-22 VITALS — BP 122/62 | HR 72 | Resp 15 | Ht 62.5 in | Wt 138.0 lb

## 2017-02-22 DIAGNOSIS — D0511 Intraductal carcinoma in situ of right breast: Secondary | ICD-10-CM

## 2017-02-22 NOTE — Progress Notes (Signed)
Patient ID: Kristin Davies, female   DOB: 10-13-1945, 71 y.o.   MRN: [id number]  Chief Complaint  Patient presents with  . Follow-up    HPI Kristin Davies is a 71 y.o. female who presents for her follow up breast cancer and a breast evaluation. The most recent mammogram was done on  02/14/2017.  Patient does perform regular self breast checks and gets regular mammograms done.   No new breast issues. She does admit to doing more around the house and lifting weights, her right axillary area is sore. Wearing brace left knee, strained muscle from yoga.  HPI  Past Medical History:  Diagnosis Date  . Arthritis   . Breast cancer ([place]) 2016   DCIS, ER negative, PR negative, high-grade. 3.5 cm. 1 mm deep margin (pectoralis fascia). MammoSite radiation.  . Cancer ([place]) 03/26/2015   DCIS, ER negative, PR negative, high-grade. 3.5 cm. 1 mm deep margin (pectoralis fascia).  . Osteoarthritis   . Osteoporosis   . Personal history of radiation therapy 2016   BREAST CA  . PONV (postoperative nausea and vomiting)   . SVT (supraventricular tachycardia) ([place]) 2012  . Trigeminal neuralgia     Past Surgical History:  Procedure Laterality Date  . BREAST BIOPSY Right 1985   Negative  . BREAST BIOPSY Right 03/02/2015   DCIS  . BREAST CYST ASPIRATION Right 1985   Negative  . [place]   . BREAST LUMPECTOMY Right 03/26/2015   Procedure: BREAST LUMPECTOMY;  Surgeon: Robert Bellow, MD;  Location: [facility] ORS;  Service: General;  Laterality: Right;  . BREAST MAMMOSITE Right 04/28/2015  . BUNIONECTOMY WITH HAMMERTOE RECONSTRUCTION Left 2012  . COLONOSCOPY  2013   Dr Vira Agar  . CRANIOPLASTY  2014  . [place]  2013  . VAGINAL HYSTERECTOMY  1978    Family History  Problem Relation Age of Onset  . Alzheimer's disease Mother   . Breast cancer Paternal Aunt        mid 59's    Social History Social History  Substance Use Topics  . Smoking  status: Never Smoker  . Smokeless tobacco: Never Used  . Alcohol use 0.0 oz/week     Comment: 1/day    No Known Allergies  Current Outpatient Prescriptions  Medication Sig Dispense Refill  . acetaminophen (TYLENOL) 500 MG tablet Take 500 mg by mouth every 6 (six) hours as needed.    Marland Kitchen alendronate (FOSAMAX) 70 MG tablet     . Aspirin-Acetaminophen-Caffeine (EXCEDRIN PO) Take 1 tablet by mouth 2 (two) times daily as needed.    . Biotin 5 MG TBDP Take by mouth.    . Cholecalciferol (VITAMIN D3) 1000 UNITS CAPS Take by mouth.    . Coenzyme Q10 10 MG capsule Take by mouth.    . Cyanocobalamin (VITAMIN B-12) 1000 MCG SUBL Take by mouth.    . Garlic 9892 MG CAPS Take by mouth.    . Glucosamine-Chondroit-Vit C-Mn (GLUCOSAMINE CHONDR 1500 COMPLX) CAPS Take by mouth daily.     Marland Kitchen lamoTRIgine (LAMICTAL) 100 MG tablet 100 mg 2 (two) times daily. 50 mg at HS    . Lutein 40 MG CAPS Take by mouth.    . metoprolol succinate (TOPROL-XL) 50 MG 24 hr tablet Take 50 mg by mouth daily.     . Multiple Vitamin (MULTIVITAMIN) capsule Take 1 capsule by mouth daily.    . Omega-3 Fatty Acids (OMEGA-3 FISH OIL PO) Take 1,500  mg by mouth.    . pseudoephedrine-acetaminophen (TYLENOL SINUS) 30-500 MG TABS tablet Take 1 tablet by mouth every 4 (four) hours as needed.    . vitamin C (ASCORBIC ACID) 500 MG tablet Take by mouth.     No current facility-administered medications for this visit.     Review of Systems Review of Systems  Constitutional: Negative.   Respiratory: Negative.   Cardiovascular: Negative.     Blood pressure 122/62, pulse 72, resp. rate 15, height 5' 2.5" (1.588 m), weight 138 lb (62.6 kg).  Physical Exam Physical Exam  Constitutional: She is oriented to person, place, and time. She appears well-developed and well-nourished.  HENT:  Mouth/Throat: Oropharynx is clear and moist.  Eyes: Conjunctivae are normal. No scleral icterus.  Neck: Neck supple.  Cardiovascular: Normal rate, regular  rhythm and normal heart sounds.   Pulmonary/Chest: Effort normal and breath sounds normal. Right breast exhibits no inverted nipple, no mass, no nipple discharge, no skin change and no tenderness. Left breast exhibits no inverted nipple, no mass, no nipple discharge, no skin change and no tenderness.    Abdominal: Soft. Bowel sounds are normal.  Lymphadenopathy:    She has no cervical adenopathy.    She has no axillary adenopathy.       Right: No supraclavicular adenopathy present.  Neurological: She is alert and oriented to person, place, and time.  Skin: Skin is warm and dry.  Psychiatric: Her behavior is normal.    Data Reviewed 02/14/2017 bilateral diagnostic mammograms reviewed. By red-2.  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    The patient has been asked to return to the office in one year with a bilateral diagnostic mammogram.   HPI, Physical Exam, Assessment and Plan have been scribed under the direction and in the presence of Robert Bellow, MD. Karie Fetch, RN  I have completed the exam and reviewed the above documentation for accuracy and completeness.  I agree with the above.  Haematologist has been used and any errors in dictation or transcription are unintentional.  Hervey Ard, M.D., F.A.C.S.  Robert Bellow 02/22/2017, 10:45 AM

## 2017-02-22 NOTE — Patient Instructions (Signed)
The patient has been asked to return to the office in one year with a bilateral diagnostic mammogram. 

## 2017-06-13 IMAGING — MG MM DIGITAL DIAGNOSTIC UNILAT*R*
2 series · 2 of 2 positions shown · non-contrast
Comparison: Previous exam(s).

CLINICAL DATA: 69-year-old female, callback from screening
mammogram for right breast calcifications

EXAM:
DIGITAL DIAGNOSTIC RIGHT MAMMOGRAM

[R ML]
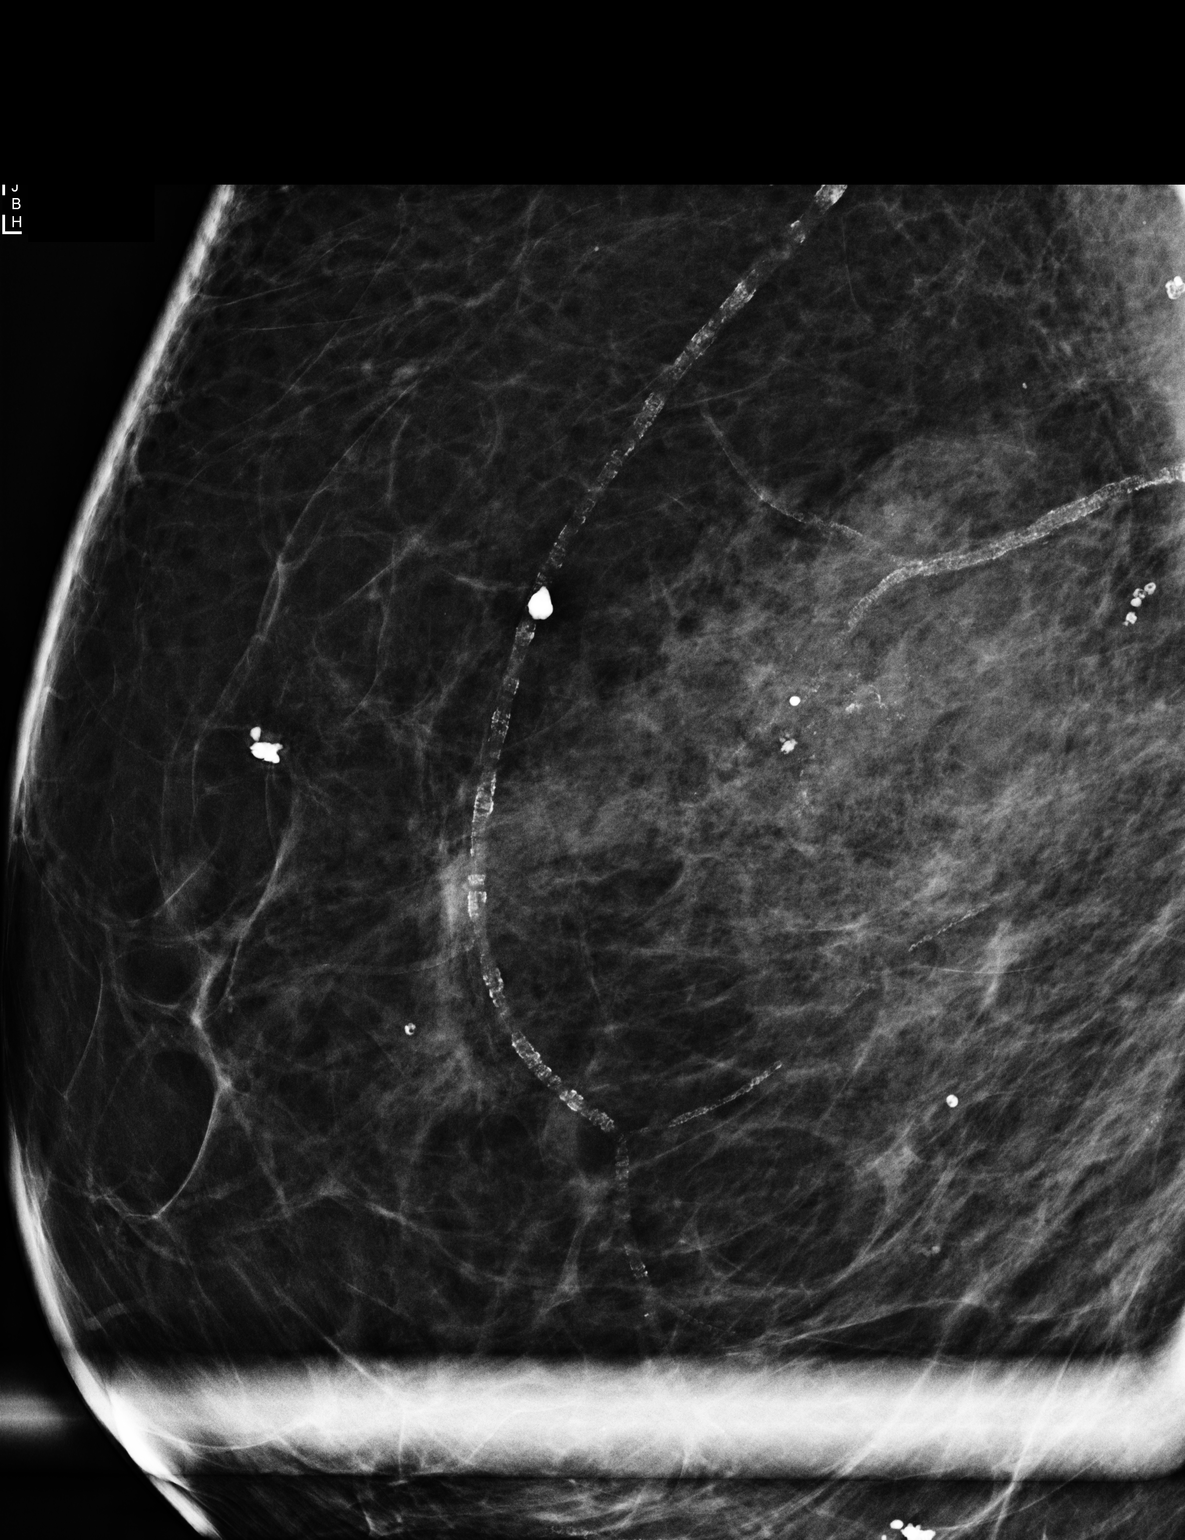

[R CC]
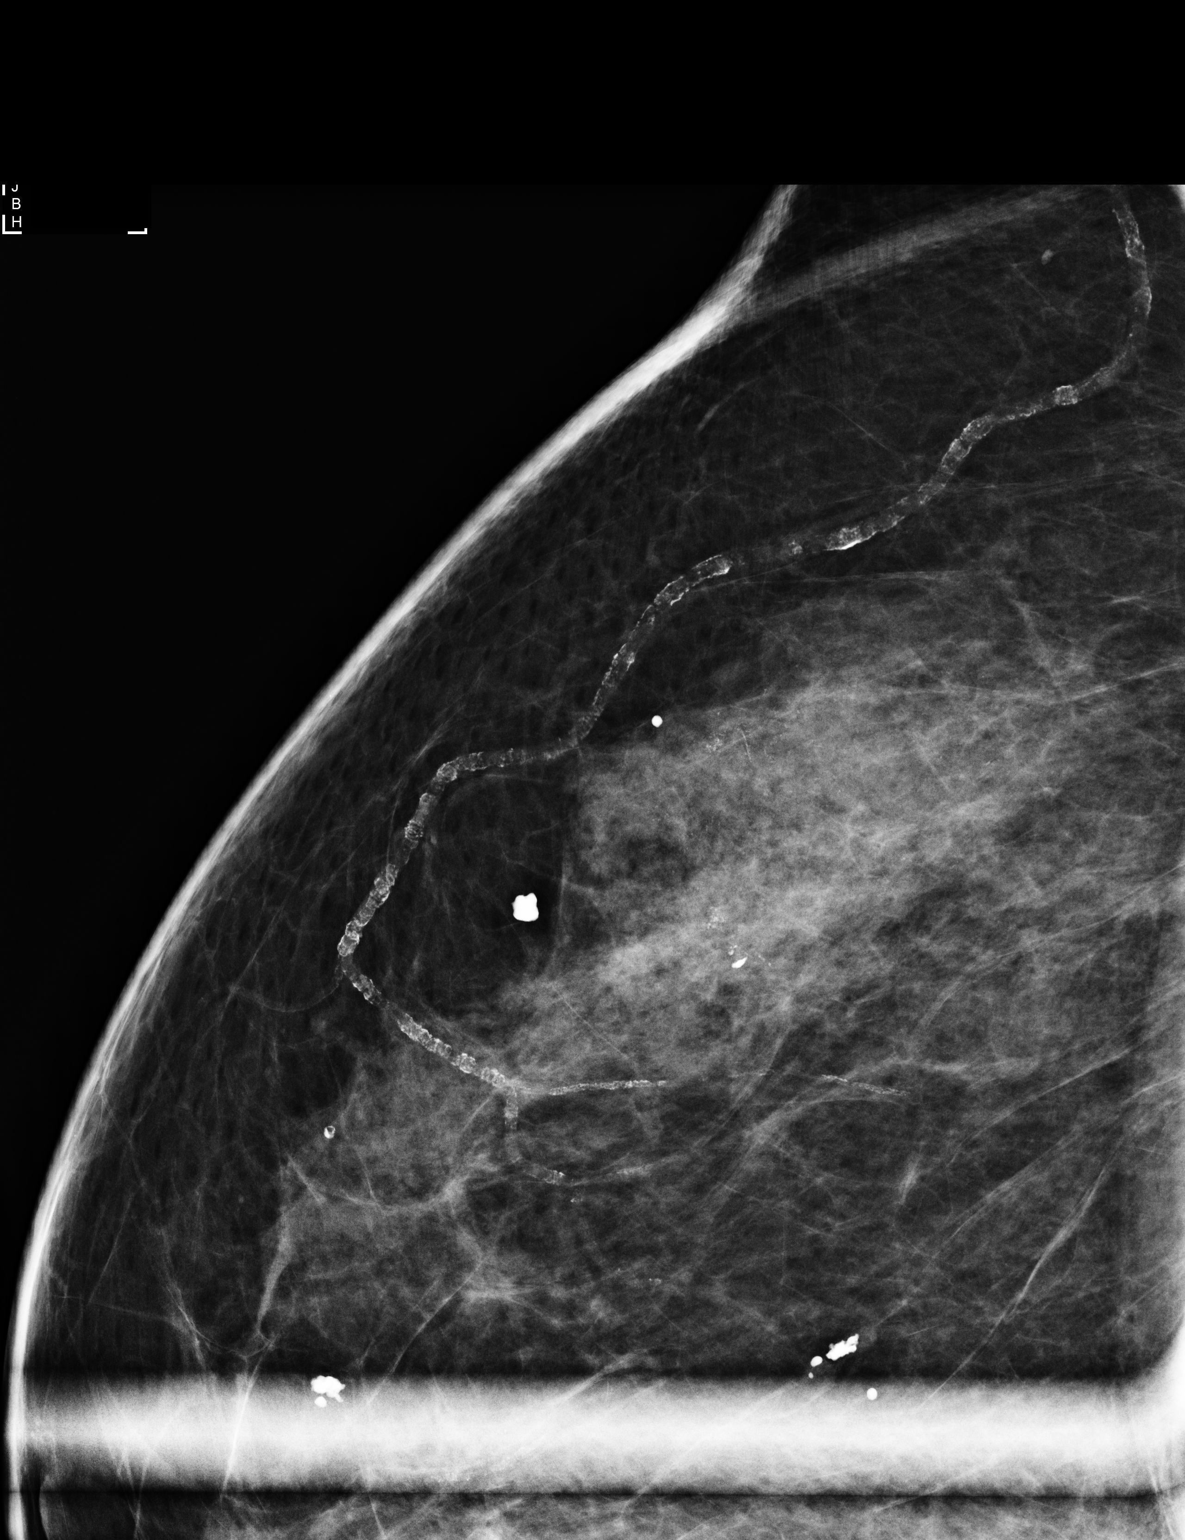

[2 of 2 positions shown; findings below may reference images not displayed]

ACR Breast Density Category c: The breast tissue is heterogeneously
dense, which may obscure small masses.
FINDINGS: CC and ML magnification views of the right breast were performed. On
the additional views, there is an approximately 4 cm area of
pleomorphic calcifications in a regional distribution in the upper,
outer right breast. No associated mass is identified. The patient
reports a prior excisional biopsy of the upper, outer right breast
demonstrating a fibroadenoma.
IMPRESSION: Indeterminate right breast calcifications.

RECOMMENDATION:
Stereotactic guided right breast biopsy.

I have discussed the findings and recommendations with the patient.
Results were also provided in writing at the conclusion of the
visit. If applicable, a reminder letter will be sent to the patient
regarding the next appointment.

BI-RADS CATEGORY  4: Suspicious.

## 2017-09-19 ENCOUNTER — Encounter: Payer: Self-pay | Admitting: *Deleted

## 2017-12-11 ENCOUNTER — Other Ambulatory Visit: Payer: Self-pay | Admitting: Neurology

## 2017-12-11 DIAGNOSIS — D329 Benign neoplasm of meninges, unspecified: Secondary | ICD-10-CM

## 2017-12-16 ENCOUNTER — Ambulatory Visit: Payer: Medicare Other

## 2017-12-28 ENCOUNTER — Ambulatory Visit: Payer: Medicare Other | Admitting: Radiation Oncology

## 2018-01-03 ENCOUNTER — Ambulatory Visit: Admission: RE | Admit: 2018-01-03 | Payer: Medicare Other | Source: Ambulatory Visit

## 2018-01-05 ENCOUNTER — Ambulatory Visit: Payer: Medicare Other | Admitting: Radiation Oncology

## 2018-01-17 ENCOUNTER — Encounter: Payer: Self-pay | Admitting: Radiation Oncology

## 2018-01-17 ENCOUNTER — Other Ambulatory Visit: Payer: Self-pay

## 2018-01-17 ENCOUNTER — Ambulatory Visit
Admission: RE | Admit: 2018-01-17 | Discharge: 2018-01-17 | Disposition: A | Discharge status: Home / Self Care | Payer: Medicare Other | Source: Ambulatory Visit | Attending: Radiation Oncology | Admitting: Radiation Oncology

## 2018-01-17 VITALS — BP 147/81 | HR 69 | Temp 98.3°F | Resp 18 | Wt 138.4 lb

## 2018-01-17 DIAGNOSIS — C50411 Malignant neoplasm of upper-outer quadrant of right female breast: Secondary | ICD-10-CM

## 2018-01-17 DIAGNOSIS — Z171 Estrogen receptor negative status [ER-]: Principal | ICD-10-CM

## 2018-01-17 NOTE — Progress Notes (Signed)
Radiation Oncology Follow up Note  Name: Kristin Davies   Date:   01/17/2018 MRN:  [id number] DOB: 1945/11/23    This 72 y.o. 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 Seen today in routine follow-up she is doing well. She specifically denies breast tenderness cough or bone pain..she had a mammogram close to year ago which I have reviewed was BI-RADS 2 benign. She is not on antiestrogen therapy based on the ER/PR negative nature of her disease.  COMPLICATIONS OF TREATMENT: none  FOLLOW UP COMPLIANCE: keeps appointments   PHYSICAL EXAM:  BP (!) 147/81 (BP Location: Left Arm, Patient Position: Sitting)   Pulse 69   Temp 98.3 F (36.8 C) (Tympanic)   Resp 18   Wt 138 lb 7.2 oz (62.8 kg)   BMI 24.92 kg/m  Lungs are clear to A&P cardiac examination essentially unremarkable with regular rate and rhythm. No dominant mass or nodularity is noted in either breast in 2 positions examined. Incision is well-healed. No axillary or supraclavicular adenopathy is appreciated. Cosmetic result is excellent.Well-developed well-nourished patient in NAD. HEENT reveals PERLA, EOMI, discs not visualized.  Oral cavity is clear. No oral mucosal lesions are identified. Neck is clear without evidence of cervical or supraclavicular adenopathy. Lungs are clear to A&P. Cardiac examination is essentially unremarkable with regular rate and rhythm without murmur rub or thrill. Abdomen is benign with no organomegaly or masses noted. Motor sensory and DTR levels are equal and symmetric in the upper and lower extremities. Cranial nerves II through XII are grossly intact.  Proprioception is intact. No peripheral adenopathy or edema is identified. No motor or sensory levels are noted. Crude visual fields are within normal range.  RADIOLOGY RESULTS: mammograms are reviewed and compatible with the above-stated findings  PLAN: present time patient is doing well with no evidence of disease. We'll see her back in 1 year for follow-up. She is a rescheduled for follow-up mammograms. Patient knows to call anytime with any concerns.  I would like to take this opportunity to thank you for allowing me to participate in the care of your patient.Noreene Filbert, MD

## 2018-01-18 ENCOUNTER — Ambulatory Visit
Admission: RE | Admit: 2018-01-18 | Discharge: 2018-01-18 | Disposition: A | Discharge status: Home / Self Care | Payer: Medicare Other | Source: Ambulatory Visit | Attending: Neurology | Admitting: Neurology

## 2018-01-18 DIAGNOSIS — D329 Benign neoplasm of meninges, unspecified: Secondary | ICD-10-CM | POA: Diagnosis not present

## 2018-01-18 LAB — POCT I-STAT CREATININE: Creatinine, Ser: 0.9 mg/dL (ref 0.44–1.00)

## 2018-01-18 MED ORDER — GADOBENATE DIMEGLUMINE 529 MG/ML IV SOLN
15.0000 mL | Freq: Once | INTRAVENOUS | Status: AC | PRN
  Administered 2018-01-18: 12 mL via INTRAVENOUS

## 2018-01-29 ENCOUNTER — Other Ambulatory Visit: Payer: Self-pay

## 2018-01-29 DIAGNOSIS — D0511 Intraductal carcinoma in situ of right breast: Secondary | ICD-10-CM

## 2018-01-30 ENCOUNTER — Other Ambulatory Visit: Payer: Self-pay

## 2018-01-30 DIAGNOSIS — D0511 Intraductal carcinoma in situ of right breast: Secondary | ICD-10-CM

## 2018-02-06 ENCOUNTER — Other Ambulatory Visit: Payer: Self-pay

## 2018-02-06 DIAGNOSIS — D0511 Intraductal carcinoma in situ of right breast: Secondary | ICD-10-CM

## 2018-03-07 ENCOUNTER — Ambulatory Visit: Payer: Medicare Other

## 2018-03-07 ENCOUNTER — Other Ambulatory Visit: Payer: Medicare Other

## 2018-03-09 ENCOUNTER — Ambulatory Visit
Admission: RE | Admit: 2018-03-09 | Discharge: 2018-03-09 | Disposition: A | Discharge status: Home / Self Care | Payer: Medicare Other | Source: Ambulatory Visit | Attending: General Surgery | Admitting: General Surgery

## 2018-03-09 DIAGNOSIS — D0511 Intraductal carcinoma in situ of right breast: Secondary | ICD-10-CM

## 2018-04-05 ENCOUNTER — Ambulatory Visit: Payer: Medicare Other | Admitting: General Surgery

## 2018-04-24 ENCOUNTER — Other Ambulatory Visit: Payer: Self-pay

## 2018-04-24 ENCOUNTER — Ambulatory Visit: Payer: Medicare Other | Admitting: General Surgery

## 2018-04-24 ENCOUNTER — Encounter: Payer: Self-pay | Admitting: General Surgery

## 2018-04-24 VITALS — BP 169/91 | HR 85 | Temp 97.5°F | Resp 16 | Ht 62.0 in | Wt 138.4 lb

## 2018-04-24 DIAGNOSIS — D0511 Intraductal carcinoma in situ of right breast: Secondary | ICD-10-CM | POA: Diagnosis not present

## 2018-04-24 NOTE — Patient Instructions (Signed)
The patient is aware to call back for any questions or concerns. The patient has been asked to return to the office in one year with a bilateral diagnostic mammogram. 

## 2018-04-24 NOTE — Progress Notes (Signed)
Patient ID: Kristin Davies, female   DOB: April 08, 1946, 72 y.o.   MRN: [id number]  Chief Complaint  Patient presents with  . Follow-up    one year follow up diagnostic mammogram 03/07/18    HPI Kristin Davies is a 72 y.o. female.  Here today for one year follow up diagnostic screening mammogram. No complaints today. She does self breast exams monthly. HPI  Past Medical History:  Diagnosis Date  . Arthritis   . Breast cancer ([place]) 2016   DCIS, ER negative, PR negative, high-grade. 3.5 cm. 1 mm deep margin (pectoralis fascia). MammoSite radiation.  . Cancer ([place]) 03/26/2015   DCIS, ER negative, PR negative, high-grade. 3.5 cm. 1 mm deep margin (pectoralis fascia).  . Osteoarthritis   . Osteoporosis   . Personal history of radiation therapy 2016   BREAST CA  . PONV (postoperative nausea and vomiting)   . SVT (supraventricular tachycardia) ([place]) 2012  . Trigeminal neuralgia     Past Surgical History:  Procedure Laterality Date  . BREAST BIOPSY Right 1985   Negative  . BREAST BIOPSY Right 03/02/2015   DCIS  . BREAST CYST ASPIRATION Right 1985   Negative  . [place]   . BREAST LUMPECTOMY Right 03/26/2015   Procedure: BREAST LUMPECTOMY;  Surgeon: Robert Bellow, MD;  Location: [facility] ORS;  Service: General;  Laterality: Right;  . BREAST MAMMOSITE Right 04/28/2015  . BUNIONECTOMY WITH HAMMERTOE RECONSTRUCTION Left 2012  . COLONOSCOPY  2013   Dr Vira Agar  . CRANIOPLASTY  2014  . [place]  2013  . VAGINAL HYSTERECTOMY  1978    Family History  Problem Relation Age of Onset  . Alzheimer's disease Mother   . Breast cancer Paternal Aunt        mid 56's    Social History Social History   Tobacco Use  . Smoking status: Never Smoker  . Smokeless tobacco: Never Used  Substance Use Topics  . Alcohol use: Yes    Alcohol/week: 0.0 standard drinks    Comment: 1/day  . Drug use: No    No Known Allergies  Current  Outpatient Medications  Medication Sig Dispense Refill  . acetaminophen (TYLENOL) 500 MG tablet Take 500 mg by mouth every 6 (six) hours as needed.    Marland Kitchen alendronate (FOSAMAX) 70 MG tablet     . Aspirin-Acetaminophen-Caffeine (EXCEDRIN PO) Take 1 tablet by mouth 2 (two) times daily as needed.    . Biotin 5 MG TBDP Take by mouth.    . Cholecalciferol (VITAMIN D3) 1000 UNITS CAPS Take by mouth.    . Coenzyme Q10 10 MG capsule Take by mouth.    . Cyanocobalamin (VITAMIN B-12) 1000 MCG SUBL Take by mouth.    . Garlic 1245 MG CAPS Take by mouth.    . Glucosamine-Chondroit-Vit C-Mn (GLUCOSAMINE CHONDR 1500 COMPLX) CAPS Take by mouth daily.     Marland Kitchen lamoTRIgine (LAMICTAL) 100 MG tablet 100 mg 2 (two) times daily. 50 mg at HS    . Lutein 40 MG CAPS Take by mouth.    . metoprolol succinate (TOPROL-XL) 50 MG 24 hr tablet Take 50 mg by mouth daily.     . Multiple Vitamin (MULTIVITAMIN) capsule Take 1 capsule by mouth daily.    . Omega-3 Fatty Acids (OMEGA-3 FISH OIL PO) Take 1,500 mg by mouth.    . pseudoephedrine-acetaminophen (TYLENOL SINUS) 30-500 MG TABS tablet Take 1 tablet by mouth every 4 (four) hours  as needed.    . vitamin C (ASCORBIC ACID) 500 MG tablet Take by mouth.     No current facility-administered medications for this visit.     Review of Systems Review of Systems  Constitutional: Negative.   Respiratory: Negative.   Skin: Negative.     Blood pressure (!) 169/91, pulse 85, temperature (!) 97.5 F (36.4 C), temperature source Temporal, resp. rate 16, height 5\' 2"  (1.575 m), weight 138 lb 6.4 oz (62.8 kg), SpO2 95 %.  Physical Exam Physical Exam  Constitutional: She is oriented to person, place, and time. She appears well-developed and well-nourished.  Eyes: Conjunctivae are normal.  Neck: Normal range of motion.  Cardiovascular: Normal rate, regular rhythm and normal heart sounds.  Pulmonary/Chest: Effort normal and breath sounds normal.    Neurological: She is alert and  oriented to person, place, and time.  Skin: Skin is warm and dry.    Data Reviewed Bilateral diagnostic mammograms dated March 09, 2018 were reviewed.  Near fatty replaced breast.  Minimal postsurgical changes noted in the upper outer quadrant.  BI-RADS-2.  Assessment    Doing well now 3 years status post management of DCIS of the right breast.  Focal thickening likely secondary to late effects of radiation.    Plan    The patient has been asked to return to the office in one year with a bilateral diagnostic mammogram.       HPI, Physical Exam, Assessment and Plan have been scribed under the direction and in the presence of Robert Bellow, MD. Jonnie Finner, CMA  I have completed the exam and reviewed the above documentation for accuracy and completeness.  I agree with the above.  Haematologist has been used and any errors in dictation or transcription are unintentional.  Hervey Ard, M.D., F.A.C.S.  Kristin Davies 04/24/2018, 8:10 PM

## 2018-11-13 ENCOUNTER — Encounter: Payer: Self-pay | Admitting: *Deleted

## 2018-11-13 ENCOUNTER — Other Ambulatory Visit: Payer: Self-pay | Admitting: Internal Medicine

## 2018-11-13 ENCOUNTER — Other Ambulatory Visit: Payer: Self-pay

## 2018-11-13 ENCOUNTER — Observation Stay
Admission: EM | Admit: 2018-11-13 | Discharge: 2018-11-13 | Disposition: A | Discharge status: Home / Self Care | Payer: Medicare Other | Attending: Internal Medicine | Admitting: Internal Medicine

## 2018-11-13 ENCOUNTER — Emergency Department: Payer: Medicare Other

## 2018-11-13 ENCOUNTER — Observation Stay (HOSPITAL_BASED_OUTPATIENT_CLINIC_OR_DEPARTMENT_OTHER): Payer: Medicare Other

## 2018-11-13 DIAGNOSIS — R030 Elevated blood-pressure reading, without diagnosis of hypertension: Secondary | ICD-10-CM

## 2018-11-13 DIAGNOSIS — G500 Trigeminal neuralgia: Secondary | ICD-10-CM | POA: Diagnosis not present

## 2018-11-13 DIAGNOSIS — I110 Hypertensive heart disease with heart failure: Secondary | ICD-10-CM | POA: Diagnosis not present

## 2018-11-13 DIAGNOSIS — I700 Atherosclerosis of aorta: Secondary | ICD-10-CM | POA: Diagnosis not present

## 2018-11-13 DIAGNOSIS — I10 Essential (primary) hypertension: Secondary | ICD-10-CM | POA: Diagnosis present

## 2018-11-13 DIAGNOSIS — M1990 Unspecified osteoarthritis, unspecified site: Secondary | ICD-10-CM | POA: Insufficient documentation

## 2018-11-13 DIAGNOSIS — R079 Chest pain, unspecified: Secondary | ICD-10-CM | POA: Diagnosis present

## 2018-11-13 DIAGNOSIS — E7800 Pure hypercholesterolemia, unspecified: Secondary | ICD-10-CM

## 2018-11-13 DIAGNOSIS — Z1159 Encounter for screening for other viral diseases: Secondary | ICD-10-CM | POA: Insufficient documentation

## 2018-11-13 DIAGNOSIS — I2510 Atherosclerotic heart disease of native coronary artery without angina pectoris: Secondary | ICD-10-CM | POA: Insufficient documentation

## 2018-11-13 DIAGNOSIS — E785 Hyperlipidemia, unspecified: Secondary | ICD-10-CM | POA: Insufficient documentation

## 2018-11-13 DIAGNOSIS — E538 Deficiency of other specified B group vitamins: Secondary | ICD-10-CM | POA: Diagnosis not present

## 2018-11-13 DIAGNOSIS — Z7982 Long term (current) use of aspirin: Secondary | ICD-10-CM | POA: Insufficient documentation

## 2018-11-13 DIAGNOSIS — Z79899 Other long term (current) drug therapy: Secondary | ICD-10-CM | POA: Diagnosis not present

## 2018-11-13 DIAGNOSIS — I160 Hypertensive urgency: Secondary | ICD-10-CM | POA: Insufficient documentation

## 2018-11-13 DIAGNOSIS — I200 Unstable angina: Secondary | ICD-10-CM

## 2018-11-13 DIAGNOSIS — Z853 Personal history of malignant neoplasm of breast: Secondary | ICD-10-CM | POA: Insufficient documentation

## 2018-11-13 DIAGNOSIS — M810 Age-related osteoporosis without current pathological fracture: Secondary | ICD-10-CM | POA: Diagnosis not present

## 2018-11-13 LAB — NM MYOCAR MULTI W/SPECT W/WALL MOTION / EF
Estimated workload: 1 METS
Exercise duration (min): 0 min
Exercise duration (sec): 0 s
LV dias vol: 54 mL (ref 46–106)
LV sys vol: 24 mL
MPHR: 147 {beats}/min
Peak HR: 116 {beats}/min
Percent HR: 78 %
Rest HR: 72 {beats}/min
TID: 1.17

## 2018-11-13 LAB — BASIC METABOLIC PANEL
Anion gap: 10 (ref 5–15)
BUN: 23 mg/dL (ref 8–23)
CO2: 27 mmol/L (ref 22–32)
Calcium: 8.9 mg/dL (ref 8.9–10.3)
Chloride: 102 mmol/L (ref 98–111)
Creatinine, Ser: 0.73 mg/dL (ref 0.44–1.00)
GFR calc Af Amer: >60 mL/min (ref >60)
GFR calc non Af Amer: >60 mL/min (ref >60)
Glucose, Bld: 120 mg/dL — ABNORMAL HIGH (ref 70–99)
Potassium: 4 mmol/L (ref 3.5–5.1)
Sodium: 139 mmol/L (ref 135–145)

## 2018-11-13 LAB — TROPONIN I
Troponin I: <0.03 ng/mL (ref <0.03)
Troponin I: <0.03 ng/mL (ref <0.03)
Troponin I: <0.03 ng/mL (ref <0.03)
Troponin I: <0.03 ng/mL (ref <0.03)

## 2018-11-13 LAB — LIPID PANEL
Cholesterol: 293 mg/dL — ABNORMAL HIGH (ref 0–200)
HDL: 89 mg/dL (ref >40)
LDL Cholesterol: 186 mg/dL — ABNORMAL HIGH (ref 0–99)
Total CHOL/HDL Ratio: 3.3 RATIO
Triglycerides: 91 mg/dL (ref <150)
VLDL: 18 mg/dL (ref 0–40)

## 2018-11-13 LAB — CBC
HCT: 41.5 % (ref 36.0–46.0)
Hemoglobin: 13.3 g/dL (ref 12.0–15.0)
MCH: 30.8 pg (ref 26.0–34.0)
MCHC: 32 g/dL (ref 30.0–36.0)
MCV: 96.1 fL (ref 80.0–100.0)
Platelets: 243 10*3/uL (ref 150–400)
RBC: 4.32 MIL/uL (ref 3.87–5.11)
RDW: 14.1 % (ref 11.5–15.5)
WBC: 7.1 10*3/uL (ref 4.0–10.5)
nRBC: 0 % (ref 0.0–0.2)

## 2018-11-13 LAB — SARS CORONAVIRUS 2 BY RT PCR (HOSPITAL ORDER, PERFORMED IN ~~LOC~~ HOSPITAL LAB): SARS Coronavirus 2: NEGATIVE

## 2018-11-13 MED ORDER — SODIUM CHLORIDE 0.9% FLUSH: 3.0000 mL | Freq: Once | INTRAVENOUS | Status: DC

## 2018-11-13 MED ORDER — ROSUVASTATIN CALCIUM 10 MG PO TABS
20.0000 mg | ORAL_TABLET | Freq: Every day | ORAL | Status: DC
  Administered 2018-11-13: 20 mg via ORAL
  Filled 2018-11-13: qty 2

## 2018-11-13 MED ORDER — NITROGLYCERIN 0.4 MG SL SUBL: 0.4000 mg | SUBLINGUAL_TABLET | SUBLINGUAL | Status: DC | PRN

## 2018-11-13 MED ORDER — LAMOTRIGINE 100 MG PO TABS: 150.0000 mg | ORAL_TABLET | Freq: Two times a day (BID) | ORAL | Status: DC

## 2018-11-13 MED ORDER — LIDOCAINE VISCOUS HCL 2 % MT SOLN
15.0000 mL | Freq: Once | OROMUCOSAL | Status: DC
  Filled 2018-11-13: qty 15

## 2018-11-13 MED ORDER — ASPIRIN 81 MG PO TBEC: 81.0000 mg | DELAYED_RELEASE_TABLET | Freq: Every day | ORAL | 2 refills | Status: AC

## 2018-11-13 MED ORDER — ISOSORBIDE MONONITRATE ER 30 MG PO TB24
15.0000 mg | ORAL_TABLET | Freq: Every day | ORAL | Status: DC
  Administered 2018-11-13: 15 mg via ORAL
  Filled 2018-11-13: qty 1

## 2018-11-13 MED ORDER — ISOSORBIDE MONONITRATE ER 30 MG PO TB24: 15.0000 mg | ORAL_TABLET | Freq: Every day | ORAL | 2 refills | Status: DC

## 2018-11-13 MED ORDER — NITROGLYCERIN 2 % TD OINT
0.5000 [in_us] | TOPICAL_OINTMENT | Freq: Once | TRANSDERMAL | Status: AC
  Administered 2018-11-13: 0.5 [in_us] via TOPICAL
  Filled 2018-11-13: qty 1

## 2018-11-13 MED ORDER — NITROGLYCERIN 0.4 MG SL SUBL: 0.4000 mg | SUBLINGUAL_TABLET | SUBLINGUAL | 1 refills | Status: DC | PRN

## 2018-11-13 MED ORDER — ASPIRIN 81 MG PO CHEW
324.0000 mg | CHEWABLE_TABLET | Freq: Once | ORAL | Status: AC
  Administered 2018-11-13: 324 mg via ORAL
  Filled 2018-11-13: qty 4

## 2018-11-13 MED ORDER — ADULT MULTIVITAMIN W/MINERALS CH
1.0000 | ORAL_TABLET | Freq: Every day | ORAL | Status: DC
  Filled 2018-11-13: qty 1

## 2018-11-13 MED ORDER — ROSUVASTATIN CALCIUM 20 MG PO TABS: 20.0000 mg | ORAL_TABLET | Freq: Every day | ORAL | 2 refills | Status: DC

## 2018-11-13 MED ORDER — MORPHINE SULFATE (PF) 2 MG/ML IV SOLN: 2.0000 mg | INTRAVENOUS | Status: DC | PRN

## 2018-11-13 MED ORDER — ASPIRIN EC 81 MG PO TBEC: 81.0000 mg | DELAYED_RELEASE_TABLET | Freq: Every day | ORAL | Status: DC

## 2018-11-13 MED ORDER — TECHNETIUM TC 99M TETROFOSMIN IV KIT
10.2910 | PACK | Freq: Once | INTRAVENOUS | Status: AC | PRN
  Administered 2018-11-13: 10.291 via INTRAVENOUS

## 2018-11-13 MED ORDER — ENOXAPARIN SODIUM 40 MG/0.4ML ~~LOC~~ SOLN
40.0000 mg | SUBCUTANEOUS | Status: DC
  Administered 2018-11-13: 40 mg via SUBCUTANEOUS
  Filled 2018-11-13: qty 0.4

## 2018-11-13 MED ORDER — ACETAMINOPHEN 325 MG PO TABS
650.0000 mg | ORAL_TABLET | ORAL | Status: DC | PRN
  Administered 2018-11-13: 650 mg via ORAL
  Filled 2018-11-13: qty 2

## 2018-11-13 MED ORDER — VITAMIN C 500 MG PO TABS
500.0000 mg | ORAL_TABLET | Freq: Every day | ORAL | Status: DC
  Filled 2018-11-13: qty 1

## 2018-11-13 MED ORDER — OMEGA-3-ACID ETHYL ESTERS 1 G PO CAPS
2000.0000 mg | ORAL_CAPSULE | Freq: Every day | ORAL | Status: DC
  Filled 2018-11-13: qty 2

## 2018-11-13 MED ORDER — VITAMIN B-12 1000 MCG PO TABS
1000.0000 ug | ORAL_TABLET | Freq: Every day | ORAL | Status: DC
  Filled 2018-11-13: qty 1

## 2018-11-13 MED ORDER — TECHNETIUM TC 99M TETROFOSMIN IV KIT
30.0000 | PACK | Freq: Once | INTRAVENOUS | Status: AC | PRN
  Administered 2018-11-13: 29.52 via INTRAVENOUS

## 2018-11-13 MED ORDER — ASPIRIN EC 325 MG PO TBEC: 325.0000 mg | DELAYED_RELEASE_TABLET | Freq: Every day | ORAL | Status: DC

## 2018-11-13 MED ORDER — METOPROLOL SUCCINATE ER 50 MG PO TB24
50.0000 mg | ORAL_TABLET | Freq: Every day | ORAL | Status: DC
  Administered 2018-11-13: 50 mg via ORAL
  Filled 2018-11-13 (×2): qty 1

## 2018-11-13 MED ORDER — ONDANSETRON HCL 4 MG/2ML IJ SOLN: 4.0000 mg | Freq: Four times a day (QID) | INTRAMUSCULAR | Status: DC | PRN

## 2018-11-13 MED ORDER — REGADENOSON 0.4 MG/5ML IV SOLN
0.4000 mg | Freq: Once | INTRAVENOUS | Status: AC
  Administered 2018-11-13: 0.4 mg via INTRAVENOUS

## 2018-11-13 MED ORDER — ALUM & MAG HYDROXIDE-SIMETH 200-200-20 MG/5ML PO SUSP: 30.0000 mL | Freq: Once | ORAL | Status: DC

## 2018-11-13 NOTE — Discharge Summary (Signed)
[place] [facility] [place] NAME: Kristin Davies    MR#:  [id number]  DATE OF BIRTH:  07-Jun-1945  DATE OF ADMISSION:  11/13/2018   ADMITTING PHYSICIAN: Gladstone Lighter, MD  DATE OF DISCHARGE:  11/13/18  PRIMARY CARE PHYSICIAN: Kirk Ruths, MD   ADMISSION DIAGNOSIS:   Essential hypertension [I10] Chest pain, unspecified type [R07.9]  DISCHARGE DIAGNOSIS:   Active Problems:   Chest pain   SECONDARY DIAGNOSIS:   Past Medical History:  Diagnosis Date  . Arthritis   . Breast cancer ([place]) 2016   DCIS, ER negative, PR negative, high-grade. 3.5 cm. 1 mm deep margin (pectoralis fascia). MammoSite radiation.  . Cancer ([place]) 03/26/2015   DCIS, ER negative, PR negative, high-grade. 3.5 cm. 1 mm deep margin (pectoralis fascia).  . Osteoarthritis   . Osteoporosis   . Personal history of radiation therapy 2016   BREAST CA  . PONV (postoperative nausea and vomiting)   . SVT (supraventricular tachycardia) ([place]) 2012  . Trigeminal neuralgia     HOSPITAL COURSE:   73 year old female with past medical history significant for history of meningioma, ductal carcinoma in situ of the breast, history of paroxysmal SVT, trigeminal neuralgia presented to the hospital secondary to chest pain.  1.  Chest pain-likely unstable angina.  Patient has been admitted to cardiac floor.  No prior cardiac history.  Seen by cardiology.  She had a stress test done since her troponins x3 were negative.  It showed abnormal stress test with possible mid and apical anterior reversible defect.  All so left main and LAD calcification has been noted. -Patient was started on aspirin, statin.  Already on metoprolol at home.  Imdur has been added -Based on stress test results patient was advised to stay overnight for cardiac catheterization in a.m.  -Patient has not had any further chest pains here. - However patient declined and wanted to go home.  A cardiac  catheterization has been set up as outpatient for 11/16/2018 at 8:30 AM with Dr. Saunders Revel. -Instructions have been discussed with patient and she was strongly advised to call 911 or come to the emergency room if her chest pain were to return.  2.  Hypertension-patient on metoprolol.  Imdur has been added  3.  Hyperlipidemia-LDL at 186, Crestor has been started.  4.  Trigeminal neuralgia-continue Lamictal and all other supplements.  Patient is independent, lives at home with her husband.  being discharged home today  DISCHARGE CONDITIONS:   Guarded  CONSULTS OBTAINED:   Treatment Team:  Nelva Bush, MD  DRUG ALLERGIES:   No Known Allergies DISCHARGE MEDICATIONS:   Allergies as of 11/13/2018   No Known Allergies     Medication List    STOP taking these medications   EXCEDRIN PO   pseudoephedrine-acetaminophen 30-500 MG Tabs tablet Commonly known as: TYLENOL SINUS     TAKE these medications   acetaminophen 500 MG tablet Commonly known as: TYLENOL Take 500 mg by mouth every 6 (six) hours as needed.   alendronate 70 MG tablet Commonly known as: FOSAMAX Take 70 mg by mouth once a week. Friday   aspirin 81 MG EC tablet Take 1 tablet (81 mg total) by mouth daily. Start taking on: November 14, 2018   Biotin 5 MG Tbdp Take 1 tablet by mouth daily.   cholecalciferol 25 MCG (1000 UT) tablet Commonly known as: VITAMIN D Take 1 capsule by mouth daily.   Coenzyme Q10 10 MG capsule Take  by mouth.   FLAXSEED OIL PO Take 1 capsule by mouth daily.   Garlic 4259 MG Caps Take 1 capsule by mouth daily.   Glucosamine Chondr 1500 Complx Caps Take 2 capsules by mouth daily.   isosorbide mononitrate 30 MG 24 hr tablet Commonly known as: IMDUR Take 0.5 tablets (15 mg total) by mouth daily.   lamoTRIgine 100 MG tablet Commonly known as: LAMICTAL Take 150 mg by mouth 2 (two) times daily.   Lutein 40 MG Caps Take 1 capsule by mouth daily.   metoprolol succinate 50 MG 24  hr tablet Commonly known as: TOPROL-XL Take 50 mg by mouth daily.   multivitamin capsule Take 1 capsule by mouth daily.   nitroGLYCERIN 0.4 MG SL tablet Commonly known as: NITROSTAT Place 1 tablet (0.4 mg total) under the tongue every 5 (five) minutes as needed for chest pain.   OMEGA-3 FISH OIL PO Take 1,500 mg by mouth.   rosuvastatin 20 MG tablet Commonly known as: CRESTOR Take 1 tablet (20 mg total) by mouth daily at 6 PM.   Vitamin B-12 1000 MCG Subl Take 1 tablet by mouth daily.   vitamin C 500 MG tablet Commonly known as: ASCORBIC ACID Take 500 mg by mouth daily.        DISCHARGE INSTRUCTIONS:   1.  PCP follow-up in 1 to 2 weeks 2.  Cardiology follow-up as scheduled 3.  Cardiac catheterization scheduled for 11/16/2018 at 8:30 AM  DIET:   Cardiac diet  ACTIVITY:   Activity as tolerated  OXYGEN:   Home Oxygen: No.  Oxygen Delivery: room air  DISCHARGE LOCATION:   home   If you experience worsening of your admission symptoms, develop shortness of breath, life threatening emergency, suicidal or homicidal thoughts you must seek medical attention immediately by calling 911 or calling your MD immediately  if symptoms less severe.  You Must read complete instructions/literature along with all the possible adverse reactions/side effects for all the Medicines you take and that have been prescribed to you. Take any new Medicines after you have completely understood and accpet all the possible adverse reactions/side effects.   Please note  You were cared for by a hospitalist during your hospital stay. If you have any questions about your discharge medications or the care you received while you were in the hospital after you are discharged, you can call the unit and asked to speak with the hospitalist on call if the hospitalist that took care of you is not available. Once you are discharged, your primary care physician will handle any further medical issues. Please  note that NO REFILLS for any discharge medications will be authorized once you are discharged, as it is imperative that you return to your primary care physician (or establish a relationship with a primary care physician if you do not have one) for your aftercare needs so that they can reassess your need for medications and monitor your lab values.    On the day of Discharge:  VITAL SIGNS:   Blood pressure 115/74, pulse 68, temperature (!) 97.5 F (36.4 C), temperature source Oral, resp. rate 16, height 5\' 2"  (1.575 m), weight 61.4 kg, SpO2 97 %.  PHYSICAL EXAMINATION:    GENERAL:  73 y.o.-year-old patient lying in the bed with no acute distress.  EYES: Pupils equal, round, reactive to light and accommodation. No scleral icterus. Extraocular muscles intact.  HEENT: Head atraumatic, normocephalic. Oropharynx and nasopharynx clear.  NECK:  Supple, no jugular venous distention. No  thyroid enlargement, no tenderness.  LUNGS: Normal breath sounds bilaterally, no wheezing, rales,rhonchi or crepitation. No use of accessory muscles of respiration.  CARDIOVASCULAR: S1, S2 normal. No  rubs, or gallops. 2/6 systolic murmur present ABDOMEN: Soft, non-tender, non-distended. Bowel sounds present. No organomegaly or mass.  EXTREMITIES: No pedal edema, cyanosis, or clubbing.  NEUROLOGIC: Cranial nerves II through XII are intact. Muscle strength 5/5 in all extremities. Sensation intact. Gait not checked.  PSYCHIATRIC: The patient is alert and oriented x 3.  SKIN: No obvious rash, lesion, or ulcer.   DATA REVIEW:   CBC Recent Labs  Lab 11/13/18 0230  WBC 7.1  HGB 13.3  HCT 41.5  PLT 243    Chemistries  Recent Labs  Lab 11/13/18 0230  NA 139  K 4.0  CL 102  CO2 27  GLUCOSE 120*  BUN 23  CREATININE 0.73  CALCIUM 8.9     Microbiology Results  Results for orders placed or performed during the hospital encounter of 11/13/18  SARS Coronavirus 2 (CEPHEID - Performed in [place]  hospital lab), Hosp Order     Status: None   Collection Time: 11/13/18  5:46 AM   Specimen: Nasopharyngeal Swab  Result Value Ref Range Status   SARS Coronavirus 2 NEGATIVE NEGATIVE Final    Comment: (NOTE) If result is NEGATIVE SARS-CoV-2 target nucleic acids are NOT DETECTED. The SARS-CoV-2 RNA is generally detectable in upper and lower  respiratory specimens during the acute phase of infection. The lowest  concentration of SARS-CoV-2 viral copies this assay can detect is 250  copies / mL. A negative result does not preclude SARS-CoV-2 infection  and should not be used as the sole basis for treatment or other  patient management decisions.  A negative result may occur with  improper specimen collection / handling, submission of specimen other  than nasopharyngeal swab, presence of viral mutation(s) within the  areas targeted by this assay, and inadequate number of viral copies  (<250 copies / mL). A negative result must be combined with clinical  observations, patient history, and epidemiological information. If result is POSITIVE SARS-CoV-2 target nucleic acids are DETECTED. The SARS-CoV-2 RNA is generally detectable in upper and lower  respiratory specimens dur ing the acute phase of infection.  Positive  results are indicative of active infection with SARS-CoV-2.  Clinical  correlation with patient history and other diagnostic information is  necessary to determine patient infection status.  Positive results do  not rule out bacterial infection or co-infection with other viruses. If result is PRESUMPTIVE POSTIVE SARS-CoV-2 nucleic acids MAY BE PRESENT.   A presumptive positive result was obtained on the submitted specimen  and confirmed on repeat testing.  While 2019 novel coronavirus  (SARS-CoV-2) nucleic acids may be present in the submitted sample  additional confirmatory testing may be necessary for epidemiological  and / or clinical management purposes  to differentiate  between  SARS-CoV-2 and other Sarbecovirus currently known to infect humans.  If clinically indicated additional testing with an alternate test  methodology [phone]) is advised. The SARS-CoV-2 RNA is generally  detectable in upper and lower respiratory sp ecimens during the acute  phase of infection. The expected result is Negative. Fact Sheet for Patients:  StrictlyIdeas.no Fact Sheet for Healthcare Providers: BankingDealers.co.za This test is not yet approved or cleared by the [place] FDA and has been authorized for detection and/or diagnosis of SARS-CoV-2 by FDA under an Emergency Use Authorization (EUA).  This EUA will remain in effect (  meaning this test can be used) for the duration of the COVID-19 declaration under Section 564(b)(1) of the Act, 21 U.S.C. section 360bbb-3(b)(1), unless the authorization is terminated or revoked sooner. Performed at [facility], [address] [place], [place] [address]     RADIOLOGY:  Dg Chest 2 View  Result Date: 11/13/2018 CLINICAL DATA:  Chest pain EXAM: CHEST - 2 VIEW COMPARISON:  None. FINDINGS: Normal heart size and mediastinal contours. No acute infiltrate or edema. No effusion or pneumothorax. No acute osseous findings. IMPRESSION: Negative chest. Electronically Signed   By: Monte Fantasia M.D.   On: 11/13/2018 05:34   Nm Myocar Multi W/spect W/wall Motion / Ef  Result Date: 11/13/2018  Abnormal, potentially high risk pharmacologic myocardial perfusion stress test.  There is a moderate in size, mild in severity, reversible defect involving the mid and apical anterior/anterolateral segments concerning for ischemia though artifact cannot be excluded (breast attenuation and misregistration).  Left ventricular systolic function is normal (LVEF > 65%).  Borderline transient ischemic dilation was noted, which is nonspecific but can be seen with balanced ischemia.  Attenuation  correction CT demonstrates coronary artery and aortic atherosclerotic calcification.      Management plans discussed with the patient, family and they are in agreement.  CODE STATUS:     Code Status Orders  (From admission, onward)         Start     Ordered   11/13/18 0555  Full code  Continuous     11/13/18 0559        Code Status History    This patient has a current code status but no historical code status.   Advance Care Planning Activity    Advance Directive Documentation     Most Recent Value  Type of Advance Directive  Living will  Pre-existing out of facility DNR order (yellow form or pink MOST form)  -  "MOST" Form in Place?  -      TOTAL TIME TAKING CARE OF THIS PATIENT: 38 minutes.    Gladstone Lighter M.D on 11/13/2018 at 4:59 PM  Between 7am to 6pm - Pager - [phone]  After 6pm go to [email or web address] - [employer] [facility]  Office  [phone]  CC: Primary care physician; Kirk Ruths, MD   Note: This dictation was prepared with Dragon dictation along with smaller phrase technology. Any transcriptional errors that result from this process are unintentional.

## 2018-11-13 NOTE — H&P (Signed)
[place] [facility] [place] NAME: Kristin Davies    MR#:  [id number]  DATE OF BIRTH:  Aug 01, 1945  DATE OF ADMISSION:  11/13/2018  PRIMARY CARE PHYSICIAN: Kirk Ruths, MD   REQUESTING/REFERRING PHYSICIAN: Lurline Hare, MD  CHIEF COMPLAINT:   Chief Complaint  Patient presents with   Chest Pain    HISTORY OF PRESENT ILLNESS:  Kristin Davies  is a 73 y.o. Caucasian female with a known history of multiple medical problems that are mentioned below, who presented to the emergency room with acute onset of midsternal chest pain felt as indigestion and extending up to her neck.  It woke her up from sleep and she graded it at 4/10 in severity.  It was associated with diaphoresis.  She stated that she woke up around 1 AM with the pain and walked around then she became diaphoretic and her pain radiated to her left shoulder and left neck.  She took a baby aspirin as well as Pepto-Bismol and burped however that did not relieve her pain.  She had no leg pain or edema or recent travels or surgeries.  She denied any nausea or vomiting or dyspnea or palpitations with her pain.  No cough or wheezing.  No recent sick exposures.  Upon presentation to the emergency room, blood pressure was 188/88 with otherwise normal vital signs.  Labs revealed no significant abnormalities in her troponin I was less than 0.03.  Portable chest x-ray showed no acute cardiopulmonary disease and twelve-lead EKG revealed normal sinus rhythm with a rate of 90 with incomplete right bundle branch block and poor R wave progression.  COVID-19 rapid test is currently pending.  The patient was given 4 baby aspirin and an inch of nitro paste.  Her pain eased off when she was in the the ER waiting room.  She will be admitted to an observation telemetry bed for further evaluation and management.   PAST MEDICAL HISTORY:   Past Medical History:  Diagnosis Date   Arthritis    Breast cancer ([place])  2016   DCIS, ER negative, PR negative, high-grade. 3.5 cm. 1 mm deep margin (pectoralis fascia). MammoSite radiation.   Cancer ([place]) 03/26/2015   DCIS, ER negative, PR negative, high-grade. 3.5 cm. 1 mm deep margin (pectoralis fascia).   Osteoarthritis    Osteoporosis    Personal history of radiation therapy 2016   BREAST CA   PONV (postoperative nausea and vomiting)    SVT (supraventricular tachycardia) ([place]) 2012   Trigeminal neuralgia     PAST SURGICAL HISTORY:   Past Surgical History:  Procedure Laterality Date   BREAST BIOPSY Right 1985   Negative   BREAST BIOPSY Right 03/02/2015   DCIS   BREAST CYST ASPIRATION Right 1985   Negative   BREAST FIBROADENOMA SURGERY Right 1982   [facility]    BREAST LUMPECTOMY Right 03/26/2015   Procedure: BREAST LUMPECTOMY;  Surgeon: Robert Bellow, MD;  Location: [facility] ORS;  Service: General;  Laterality: Right;   BREAST MAMMOSITE Right 04/28/2015   BUNIONECTOMY WITH HAMMERTOE RECONSTRUCTION Left 2012   COLONOSCOPY  2013   Dr Vira Agar   CRANIOPLASTY  2014   [facility]  2013   VAGINAL HYSTERECTOMY  1978    SOCIAL HISTORY:   Social History   Tobacco Use   Smoking status: Never Smoker   Smokeless tobacco: Never Used  Substance Use Topics   Alcohol use: Yes    Alcohol/week: 0.0 standard drinks  Comment: 1/day    FAMILY HISTORY:   Family History  Problem Relation Age of Onset   Alzheimer's disease Mother    Breast cancer Paternal Aunt        mid 69's    DRUG ALLERGIES:  No Known Allergies  REVIEW OF SYSTEMS:   ROS As per history of present illness. All pertinent systems were reviewed above. Constitutional,  HEENT, cardiovascular, respiratory, GI, GU, musculoskeletal, neuro, psychiatric, endocrine,  integumentary and hematologic systems were reviewed and are otherwise  negative/unremarkable except for positive findings mentioned above in the HPI.   MEDICATIONS AT HOME:    Prior to Admission medications   Medication Sig Start Date End Date Taking? Authorizing Provider  acetaminophen (TYLENOL) 500 MG tablet Take 500 mg by mouth every 6 (six) hours as needed.    [provider]  alendronate (FOSAMAX) 70 MG tablet  10/27/15   [provider]  Aspirin-Acetaminophen-Caffeine (EXCEDRIN PO) Take 1 tablet by mouth 2 (two) times daily as needed.    [provider]  Biotin 5 MG TBDP Take by mouth.    [provider]  Cholecalciferol (VITAMIN D3) 1000 UNITS CAPS Take by mouth.    [provider]  Coenzyme Q10 10 MG capsule Take by mouth.    [provider]  Cyanocobalamin (VITAMIN B-12) 1000 MCG SUBL Take by mouth.    [provider]  Garlic 4098 MG CAPS Take by mouth.    [provider]  Glucosamine-Chondroit-Vit C-Mn (GLUCOSAMINE CHONDR 1500 COMPLX) CAPS Take by mouth daily.     [provider]  lamoTRIgine (LAMICTAL) 100 MG tablet 100 mg 2 (two) times daily. 50 mg at [facility] 11/21/16   [provider]  Lutein 40 MG CAPS Take by mouth.    [provider]  metoprolol succinate (TOPROL-XL) 50 MG 24 hr tablet Take 50 mg by mouth daily.  10/01/14   [provider]  Multiple Vitamin (MULTIVITAMIN) capsule Take 1 capsule by mouth daily.    [provider]  Omega-3 Fatty Acids (OMEGA-3 FISH OIL PO) Take 1,500 mg by mouth.    [provider]  pseudoephedrine-acetaminophen (TYLENOL SINUS) 30-500 MG TABS tablet Take 1 tablet by mouth every 4 (four) hours as needed.    [provider]  vitamin C (ASCORBIC ACID) 500 MG tablet Take by mouth.    [provider]      VITAL SIGNS:  Blood pressure (!) 142/96, pulse 97, temperature 98.2 F (36.8 C), temperature source Oral, resp. rate 16, SpO2 99 %.  PHYSICAL EXAMINATION:  Physical Exam  GENERAL:  73 y.o.-year-old Caucasian female patient lying in the bed with no acute distress.  EYES: Pupils  equal, round, reactive to light and accommodation. No scleral icterus. Extraocular muscles intact.  HEENT: Head atraumatic, normocephalic. Oropharynx and nasopharynx clear.  NECK:  Supple, no jugular venous distention. No thyroid enlargement, no tenderness.  LUNGS: Normal breath sounds bilaterally, no wheezing, rales,rhonchi or crepitation. No use of accessory muscles of respiration.  CARDIOVASCULAR: Regular rate and rhythm, S1, S2 normal. No murmurs, rubs, or gallops.  ABDOMEN: Soft, nondistended, nontender. Bowel sounds present. No organomegaly or mass.  EXTREMITIES: No pedal edema, cyanosis, or clubbing.  NEUROLOGIC: Cranial nerves II through XII are intact. Muscle strength 5/5 in all extremities. Sensation intact. Gait not checked.  PSYCHIATRIC: The patient is alert and oriented x 3.  Normal affect and good eye contact. SKIN: No obvious rash, lesion, or ulcer.   LABORATORY PANEL:   CBC  Recent Labs  Lab 11/13/18 0230  WBC 7.1  HGB 13.3  HCT 41.5  PLT 243   ------------------------------------------------------------------------------------------------------------------  Chemistries  Recent Labs  Lab 11/13/18 0230  NA 139  K 4.0  CL 102  CO2 27  GLUCOSE 120*  BUN 23  CREATININE 0.73  CALCIUM 8.9   ------------------------------------------------------------------------------------------------------------------  Cardiac Enzymes Recent Labs  Lab 11/13/18 0230  TROPONINI <0.03   ------------------------------------------------------------------------------------------------------------------  RADIOLOGY:  Dg Chest 2 View  Result Date: 11/13/2018 CLINICAL DATA:  Chest pain EXAM: CHEST - 2 VIEW COMPARISON:  None. FINDINGS: Normal heart size and mediastinal contours. No acute infiltrate or edema. No effusion or pneumothorax. No acute osseous findings. IMPRESSION: Negative chest. Electronically Signed   By: Monte Fantasia M.D.   On: 11/13/2018 05:34      IMPRESSION  AND PLAN:   1.Chest pain, rule out acute coronary syndrome.  The patient will be admitted to an observation telemetry bed.  Will follow serial cardiac enzymes and EKGs.  We will obtain a cardiology consult this a.m. for further cardiac risk stratification.  The patient will be placed on aspirin as well as p.r.n. sublingual nitroglycerin and morphine sulfate for pain.  I contacted Dr. Harrington Challenger with [facility] regarding the cardiology consult.  2.  Hypertensive urgency.  This could be secondary to the pain and it could be contributing to it as well.  We will continue her Toprol-XL and place on PRN IV labetalol.  3.  Vitamin B12 deficiency.  We will continue her vitamin B12.  4.  History of supraventricular tachycardia.  Her Toprol-XL will be resumed.  She will be monitored for arrhythmias.  5.  DVT prophylaxis.  Subcutaneous Lovenox and GI prophylaxis with p.o. Protonix for the possibility of GI etiology.    All the records are reviewed and case discussed with ED provider. The plan of care was discussed in details with the patient (and family). I answered all questions. The patient agreed to proceed with the above mentioned plan. Further management will depend upon hospital course.   CODE STATUS: Full code  TOTAL TIME TAKING CARE OF THIS PATIENT: 50 minutes.    Christel Mormon M.D on 11/13/2018 at 6:04 AM  Pager - [phone]  After 6pm go to [email or web address] - [employer] [facility]  Office  [phone]  CC: Primary care physician; Kirk Ruths, MD   Note: This dictation was prepared with Dragon dictation along with smaller phrase technology. Any transcriptional errors that result from this process are unintentional.

## 2018-11-13 NOTE — ED Triage Notes (Signed)
Pt awakened with chest pain tonight.   Pt also has sob.  No cough.  Nonsmoker. Pt states pain radiates into the neck.  Pt alert

## 2018-11-13 NOTE — ED Provider Notes (Signed)
[facility] Emergency Department Provider Note   ____________________________________________   First MD Initiated Contact with Patient 11/13/18 [phone]     (approximate)  I have reviewed the triage vital signs and the nursing notes.   HISTORY  Chief Complaint Chest Pain    HPI Kristin Davies is a 73 y.o. female who presents to the ED from home with a chief complaint of chest pain.  Patient was awakened by central chest achiness approximately 1 AM.  Symptoms accompanied by diaphoresis, nausea, radiation to her left neck.  Patient has a history of SVT on metoprolol.  Last treadmill stress test over 10 years ago.  Denies fever, cough, shortness of breath, abdominal pain, vomiting.  Denies recent travel, trauma or exposure to persons diagnosed with coronavirus.  Currently on Augmentin for skin sore to left anterior thigh.       Past Medical History:  Diagnosis Date  . Arthritis   . Breast cancer ([place]) 2016   DCIS, ER negative, PR negative, high-grade. 3.5 cm. 1 mm deep margin (pectoralis fascia). MammoSite radiation.  . Cancer ([place]) 03/26/2015   DCIS, ER negative, PR negative, high-grade. 3.5 cm. 1 mm deep margin (pectoralis fascia).  . Osteoarthritis   . Osteoporosis   . Personal history of radiation therapy 2016   BREAST CA  . PONV (postoperative nausea and vomiting)   . SVT (supraventricular tachycardia) ([place]) 2012  . Trigeminal neuralgia     Patient Active Problem List   Diagnosis Date Noted  . 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 Meningioma ([place]) 03/26/2016  . 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 History of breast cancer 08/20/2015  . Encounter for long-term (current) drug use 06/12/2013  . Diplopia 06/11/2013  . Right trigeminal neuralgia 06/11/2013  . SVT (supraventricular tachycardia) ([place]) 03/21/2013  . Hallux varus 09/06/2012    Past Surgical History:  Procedure Laterality Date  . BREAST  BIOPSY Right 1985   Negative  . BREAST BIOPSY Right 03/02/2015   DCIS  . BREAST CYST ASPIRATION Right 1985   Negative  . [place]   . BREAST LUMPECTOMY Right 03/26/2015   Procedure: BREAST LUMPECTOMY;  Surgeon: Robert Bellow, MD;  Location: [facility] ORS;  Service: General;  Laterality: Right;  . BREAST MAMMOSITE Right 04/28/2015  . BUNIONECTOMY WITH HAMMERTOE RECONSTRUCTION Left 2012  . COLONOSCOPY  2013   Dr Vira Agar  . CRANIOPLASTY  2014  . [place]  2013  . VAGINAL HYSTERECTOMY  1978    Prior to Admission medications   Medication Sig Start Date End Date Taking? Authorizing Provider  acetaminophen (TYLENOL) 500 MG tablet Take 500 mg by mouth every 6 (six) hours as needed.    [provider]  alendronate (FOSAMAX) 70 MG tablet  10/27/15   [provider]  Aspirin-Acetaminophen-Caffeine (EXCEDRIN PO) Take 1 tablet by mouth 2 (two) times daily as needed.    [provider]  Biotin 5 MG TBDP Take by mouth.    [provider]  Cholecalciferol (VITAMIN D3) 1000 UNITS CAPS Take by mouth.    [provider]  Coenzyme Q10 10 MG capsule Take by mouth.    [provider]  Cyanocobalamin (VITAMIN B-12) 1000 MCG SUBL Take by mouth.    [provider]  Garlic 8127 MG CAPS Take by mouth.    [provider]  Glucosamine-Chondroit-Vit C-Mn (GLUCOSAMINE CHONDR 1500 COMPLX) CAPS Take by mouth daily.  [provider]  lamoTRIgine (LAMICTAL) 100 MG tablet 100 mg 2 (two) times daily. 50 mg at [facility] 11/21/16   [provider]  Lutein 40 MG CAPS Take by mouth.    [provider]  metoprolol succinate (TOPROL-XL) 50 MG 24 hr tablet Take 50 mg by mouth daily.  10/01/14   [provider]  Multiple Vitamin (MULTIVITAMIN) capsule Take 1 capsule by mouth daily.    [provider]  Omega-3 Fatty Acids (OMEGA-3 FISH OIL PO) Take 1,500 mg by  mouth.    [provider]  pseudoephedrine-acetaminophen (TYLENOL SINUS) 30-500 MG TABS tablet Take 1 tablet by mouth every 4 (four) hours as needed.    [provider]  vitamin C (ASCORBIC ACID) 500 MG tablet Take by mouth.    [provider]    Allergies Patient has no known allergies.  Family History  Problem Relation Age of Onset  . Alzheimer's disease Mother   . Breast cancer Paternal Aunt        mid 76's    Social History Social History   Tobacco Use  . Smoking status: Never Smoker  . Smokeless tobacco: Never Used  Substance Use Topics  . Alcohol use: Yes    Alcohol/week: 0.0 standard drinks    Comment: 1/day  . Drug use: No    Review of Systems  Constitutional: No fever/chills Eyes: No visual changes. ENT: No sore throat. Cardiovascular: Positive for chest pain. Respiratory: Denies shortness of breath. Gastrointestinal: No abdominal pain.  Positive for nausea, no vomiting.  No diarrhea.  No constipation. Genitourinary: Negative for dysuria. Musculoskeletal: Negative for back pain. Skin: Positive for diaphoresis.  Negative for rash. Neurological: Negative for headaches, focal weakness or numbness.   ____________________________________________   PHYSICAL EXAM:  VITAL SIGNS: ED Triage Vitals  Enc Vitals Group     BP 11/13/18 0225 (!) 188/88     Pulse Rate 11/13/18 0225 95     Resp 11/13/18 0225 20     Temp 11/13/18 0225 98.2 F (36.8 C)     Temp Source 11/13/18 0225 Oral     SpO2 11/13/18 0225 98 %     Weight --      Height --      Head Circumference --      Peak Flow --      Pain Score 11/13/18 0226 0     Pain Loc --      Pain Edu? --      Excl. in [place]? --     Constitutional: Alert and oriented. Well appearing and in no acute distress. Eyes: Conjunctivae are normal. PERRL. EOMI. Head: Atraumatic. Nose: No congestion/rhinnorhea. Mouth/Throat: Mucous membranes are moist.  Oropharynx non-erythematous. Neck: No  stridor.   Cardiovascular: Normal rate, regular rhythm. Grossly normal heart sounds.  Good peripheral circulation. Respiratory: Normal respiratory effort.  No retractions. Lungs CTAB. Gastrointestinal: Soft and nontender to light or deep palpation. No distention. No abdominal bruits. No CVA tenderness. Musculoskeletal: No lower extremity tenderness nor edema.  No joint effusions. Neurologic:  Normal speech and language. No gross focal neurologic deficits are appreciated. No gait instability. Skin:  Skin is warm, dry and intact. No rash noted.  Small sore to left anterior thigh. Psychiatric: Mood and affect are normal. Speech and behavior are normal.  ____________________________________________   LABS (all labs ordered are listed, but only abnormal results are displayed)  Labs Reviewed  BASIC METABOLIC PANEL - Abnormal; Notable for the following components:  Result Value   Glucose, Bld 120 (*)    All other components within normal limits  SARS CORONAVIRUS 2 (HOSPITAL ORDER, [place] [facility])  CBC  TROPONIN I   ____________________________________________  EKG  ED ECG REPORT I, SUNG,JADE J, the attending physician, personally viewed and interpreted this ECG.   Date: 11/13/2018  EKG Time: 0228  Rate: 90  Rhythm: normal EKG, normal sinus rhythm  Axis: Normal  Intervals:right bundle branch block  ST&T Change: Nonspecific  ____________________________________________  RADIOLOGY  ED MD interpretation: No acute cardiopulmonary process  Official radiology report(s): Dg Chest 2 View  Result Date: 11/13/2018 CLINICAL DATA:  Chest pain EXAM: CHEST - 2 VIEW COMPARISON:  None. FINDINGS: Normal heart size and mediastinal contours. No acute infiltrate or edema. No effusion or pneumothorax. No acute osseous findings. IMPRESSION: Negative chest. Electronically Signed   By: Monte Fantasia M.D.   On: 11/13/2018 05:34     ____________________________________________   PROCEDURES  Procedure(s) performed (including Critical Care):  Procedures   ____________________________________________   INITIAL IMPRESSION / ASSESSMENT AND PLAN / ED COURSE  As part of my medical decision making, I reviewed the following data within the [place] notes reviewed and incorporated, Labs reviewed, EKG interpreted, Old chart reviewed, Radiograph reviewed, Discussed with admitting physician and Notes from prior ED visits     ANGELIN CUTRONE was evaluated in Emergency Department on 11/13/2018 for the symptoms described in the history of present illness. She was evaluated in the context of the global COVID-19 pandemic, which necessitated consideration that the patient might be at risk for infection with the SARS-CoV-2 virus that causes COVID-19. Institutional protocols and algorithms that pertain to the evaluation of patients at risk for COVID-19 are in a state of rapid change based on information released by regulatory bodies including the CDC and federal and state organizations. These policies and algorithms were followed during the patient's care in the ED.   73 year old female who presents with central chest aching/indigestion with radiation to her left neck. Differential diagnosis includes, but is not limited to, ACS, aortic dissection, pulmonary embolism, cardiac tamponade, pneumothorax, pneumonia, pericarditis, myocarditis, GI-related causes including esophagitis/gastritis, and musculoskeletal chest wall pain.    Symptoms concerning for chest pain due to CAD.  Will administer 4 baby aspirin, nitroglycerin paste.  Will discuss with hospitalist to evaluate patient in emergency department for admission.      ____________________________________________   FINAL CLINICAL IMPRESSION(S) / ED DIAGNOSES  Final diagnoses:  Chest pain, unspecified type  Essential hypertension     ED Discharge Orders     None       Note:  This document was prepared using Dragon voice recognition software and may include unintentional dictation errors.   Paulette Blanch, MD 11/13/18 [phone]

## 2018-11-13 NOTE — Progress Notes (Signed)
patient discharged home as per order, discharge instruction provided IV removed tele removed, all follow up  appointment reviewed with patient ,

## 2018-11-13 NOTE — ED Notes (Signed)
.. ED TO INPATIENT HANDOFF REPORT  ED Nurse Name and Phone #: Delton Coombes  S Name/Age/Gender Kristin Davies 73 y.o. female Room/Bed: ED02A/ED02A  Code Status   Code Status: Full Code  Home/SNF/Other Home Patient oriented to: self, place, time and situation Is this baseline? Yes   Triage Complete: Triage complete  Chief Complaint Chest pain, shortness of breath  Triage Note Pt awakened with chest pain tonight.   Pt also has sob.  No cough.  Nonsmoker. Pt states pain radiates into the neck.  Pt alert   Allergies No Known Allergies  Level of Care/Admitting Diagnosis ED Disposition    ED Disposition Condition [facility] Area: [place] [100120]  Level of Care: Telemetry [5]  Covid Evaluation: Confirmed COVID Negative  Diagnosis: Chest pain [161096]  Admitting Physician: Christel Mormon [[id number]]  Attending Physician: Christel Mormon [[id number]]  PT Class (Do Not Modify): Observation [104]  PT Acc Code (Do Not Modify): Observation [10022]       B Medical/Surgery History Past Medical History:  Diagnosis Date  . Arthritis   . Breast cancer ([place]) 2016   DCIS, ER negative, PR negative, high-grade. 3.5 cm. 1 mm deep margin (pectoralis fascia). MammoSite radiation.  . Cancer ([place]) 03/26/2015   DCIS, ER negative, PR negative, high-grade. 3.5 cm. 1 mm deep margin (pectoralis fascia).  . Osteoarthritis   . Osteoporosis   . Personal history of radiation therapy 2016   BREAST CA  . PONV (postoperative nausea and vomiting)   . SVT (supraventricular tachycardia) ([place]) 2012  . Trigeminal neuralgia    Past Surgical History:  Procedure Laterality Date  . BREAST BIOPSY Right 1985   Negative  . BREAST BIOPSY Right 03/02/2015   DCIS  . BREAST CYST ASPIRATION Right 1985   Negative  . [place]   . BREAST LUMPECTOMY Right 03/26/2015   Procedure: BREAST LUMPECTOMY;  Surgeon: Robert Bellow, MD;   Location: [facility] ORS;  Service: General;  Laterality: Right;  . BREAST MAMMOSITE Right 04/28/2015  . BUNIONECTOMY WITH HAMMERTOE RECONSTRUCTION Left 2012  . COLONOSCOPY  2013   Dr Vira Agar  . CRANIOPLASTY  2014  . [place]  2013  . VAGINAL HYSTERECTOMY  1978     A IV Location/Drains/Wounds Patient Lines/Drains/Airways Status   Active Line/Drains/Airways    Name:   Placement date:   Placement time:   Site:   Days:   Incision (Closed) 03/26/15 Breast Right   03/26/15    0952     1328          Intake/Output Last 24 hours No intake or output data in the 24 hours ending 11/13/18 0612  Labs/Imaging Results for orders placed or performed during the hospital encounter of 11/13/18 (from the past 48 hour(s))  Basic metabolic panel     Status: Abnormal   Collection Time: 11/13/18  2:30 AM  Result Value Ref Range   Sodium 139 135 - 145 mmol/L   Potassium 4.0 3.5 - 5.1 mmol/L   Chloride 102 98 - 111 mmol/L   CO2 27 22 - 32 mmol/L   Glucose, Bld 120 (H) 70 - 99 mg/dL   BUN 23 8 - 23 mg/dL   Creatinine, Ser 0.73 0.44 - 1.00 mg/dL   Calcium 8.9 8.9 - 10.3 mg/dL   GFR calc non Af Amer >60 >60 mL/min   GFR calc Af Amer >60 >60 mL/min  Anion gap 10 5 - 15    Comment: Performed at [facility], [address] [place], [place] [address]  CBC     Status: None   Collection Time: 11/13/18  2:30 AM  Result Value Ref Range   WBC 7.1 4.0 - 10.5 K/uL   RBC 4.32 3.87 - 5.11 MIL/uL   Hemoglobin 13.3 12.0 - 15.0 g/dL   HCT 41.5 36.0 - 46.0 %   MCV 96.1 80.0 - 100.0 fL   MCH 30.8 26.0 - 34.0 pg   MCHC 32.0 30.0 - 36.0 g/dL   RDW 14.1 11.5 - 15.5 %   Platelets 243 150 - 400 K/uL   nRBC 0.0 0.0 - 0.2 %    Comment: Performed at [facility], [address] [place], [place] [address]  Troponin I - ONCE - STAT     Status: None   Collection Time: 11/13/18  2:30 AM  Result Value Ref Range   Troponin I <0.03 <0.03 ng/mL    Comment: Performed at [facility], [address] [place], [place] [address]   Dg Chest 2 View  Result Date: 11/13/2018 CLINICAL DATA:  Chest pain EXAM: CHEST - 2 VIEW COMPARISON:  None. FINDINGS: Normal heart size and mediastinal contours. No acute infiltrate or edema. No effusion or pneumothorax. No acute osseous findings. IMPRESSION: Negative chest. Electronically Signed   By: Monte Fantasia M.D.   On: 11/13/2018 05:34    Pending Labs Unresulted Labs (From admission, onward)    Start     Ordered   11/20/18 0500  Creatinine, serum  (enoxaparin (LOVENOX)    CrCl >/= 30 ml/min)  Weekly,   STAT    Comments: while on enoxaparin therapy    11/13/18 0559   11/13/18 0556  Troponin I - Now Then Q3H  Now then every 3 hours,   STAT     11/13/18 0559   11/13/18 0541  SARS Coronavirus 2 (CEPHEID - Performed in [place] hospital lab), Hosp Order  (Asymptomatic Patients Labs)  Once,   STAT    Question:  Rule Out  Answer:  Yes   11/13/18 0541          Vitals/Pain Today's Vitals   11/13/18 0225 11/13/18 0226 11/13/18 0550  BP: (!) 188/88  (!) 142/96  Pulse: 95  97  Resp: 20  16  Temp: 98.2 F (36.8 C)    TempSrc: Oral    SpO2: 98%  99%  PainSc:  0-No pain     Isolation Precautions No active isolations  Medications Medications  sodium chloride flush (NS) 0.9 % injection 3 mL (3 mLs Intravenous Not Given 11/13/18 0600)  metoprolol succinate (TOPROL-XL) 24 hr tablet 50 mg (has no administration in time range)  Vitamin B-12 SUBL 1,000 mcg (has no administration in time range)  multivitamin with minerals tablet 1 tablet (has no administration in time range)  omega-3 fish oil (MAXEPA) capsule 2,000 mg (has no administration in time range)  vitamin C (ASCORBIC ACID) tablet 500 mg (has no administration in time range)  acetaminophen (TYLENOL) tablet 650 mg (has no administration in time range)  ondansetron (ZOFRAN) injection 4 mg (has no administration in time range)  enoxaparin (LOVENOX) injection 40 mg (has no  administration in time range)  morphine 2 MG/ML injection 2 mg (has no administration in time range)  alum & mag hydroxide-simeth (MAALOX/MYLANTA) 200-200-20 MG/5ML suspension 30 mL (has no administration in time range)    And  lidocaine (XYLOCAINE) 2 % viscous mouth solution 15 mL (has no administration in time range)  aspirin EC tablet 325 mg (has no administration in time range)  nitroGLYCERIN (NITROSTAT) SL tablet 0.4 mg (has no administration in time range)  aspirin chewable tablet 324 mg (324 mg Oral Given 11/13/18 0546)  nitroGLYCERIN (NITROGLYN) 2 % ointment 0.5 inch (0.5 inches Topical Given 11/13/18 0548)    Mobility walks Low fall risk   Focused Assessments Cardiac Assessment Handoff:  Cardiac Rhythm: Normal sinus rhythm Lab Results  Component Value Date   TROPONINI <0.03 11/13/2018   No results found for: DDIMER Does the Patient currently have chest pain? No     R Recommendations: See Admitting Provider Note  Report given to:   Additional Notes:

## 2018-11-13 NOTE — Discharge Instructions (Signed)
1. Cardiac cath has been set up for Friday 11/16/18 at 8:30AM- please come to the cath lab by 7:00AM that morning. Do not eat or drink anything after midnight Thursday night. 2. COVID test is negative here- so please self quarantine yourself at home- No going outside or having people over until your cardiac test 3. Please take your medications as prescribed 4. If having chest pain again- call 911

## 2018-11-13 NOTE — H&P (View-Only) (Signed)
   Progress Note  Patient Name: Kristin Davies Date of Encounter: 11/13/2018  Primary Cardiologist: New - Vaneta Hammontree  Patient continues to feel well without recurrent chest pain.  Myocardial perfusion stress test performed earlier today was reviewed, demonstrating subtle mid and apical anterior/anterolateral reversible defect that may be artifact but could also represent ischemia.  Additionally, coronary artery calcification in the LAD and left main was noted.  There is also borderline transient ischemic dilation, which is nonspecific but can be seen with balanced ischemia.  I have recommended proceeding with cardiac catheterization, though the patient would like to do this on an outpatient basis.  Given negative troponin and the lack of further chest pain, I think this is reasonable.  We will discharge her with aspirin 81 mg daily and also add rosuvastatin 20 mg daily and isosorbide mononitrate 15 mg daily.  Prescription for nitroglycerin 0.4 mg sublingual every 5 minutes as needed chest pain is also recommended.  I advised [name] to call 911 if she has any recurrent chest pain.  Arrangements have been made for left heart catheter[facility] at [facility] on 11/16/2018 at 8:30 AM.  The patient should arrive at 7 AM.  I have reviewed the risks, indications, and alternatives to cardiac catheterization, possible angioplasty, and stenting with the patient. Risks include but are not limited to bleeding, infection, vascular injury, stroke, myocardial infection, arrhythmia, kidney injury, radiation-related injury in the case of prolonged fluoroscopy use, emergency cardiac surgery, and death. The patient understands the risks of serious complication is 1-2 in [id number] with diagnostic cardiac cath and 1-2% or less with angioplasty/stenting.  For questions or updates, please contact [place] Please consult [email or web address] for contact info under [facility].    Signed, Nelva Bush, MD  11/13/2018, 4:49 PM

## 2018-11-13 NOTE — Consult Note (Addendum)
Cardiology Consultation:   Patient ID: BEYZA BELLINO MRN: [id number]; DOB: February 28, 1946  Admit date: 11/13/2018 Date of Consult: 11/13/2018  Primary Care Provider: Kirk Ruths, MD Primary Cardiologist: New - Syna Gad Primary Electrophysiologist:  None    Patient Profile:   Kristin Davies is a 73 y.o. female with a hx of PSVT, meningioma, DCIS of the breast, trigeminal neuralgia who is being seen today for the evaluation of chest pain at the request of Dr. Tressia Miners.  History of Present Illness:   Kristin Davies was in her usual state of health until approximately 1 this morning when she awoke with severe substernal chest discomfort that she describes as indigestion.  The pain also extended upwards to the left shoulder and neck.  She took both aspirin and Pepto-Bismol without any relief and subsequently presented to the emergency department about an hour after the pain began.  Here, she was given additional aspirin as well as nitroglycerin with gradual resolution of the pain.  She notes that it had already started to ease off before she received nitroglycerin.  Currently, she is tired, hungry, and slightly nauseated but otherwise feels well.  She has not had any shortness of breath, palpitations, or lightheadedness.  Kristin Davies is typically very active and exercises on a regular basis without any chest pain or dyspnea.  She notes an episode of chest pain around 2007 that led to an overnight hospitalization when she was still working as a Marine scientist in [place].  Exercise tolerance test at that time was reportedly normal.  She has been on metoprolol for several years, prescribed by Dr. Ouida Sills for SVT.  She has not seen a cardiologist in the past.  Heart Pathway Score:     Past Medical History:  Diagnosis Date  . Arthritis   . Breast cancer ([place]) 2016   DCIS, ER negative, PR negative, high-grade. 3.5 cm. 1 mm deep margin (pectoralis fascia). MammoSite radiation.  . Cancer ([place]) 03/26/2015   DCIS, ER  negative, PR negative, high-grade. 3.5 cm. 1 mm deep margin (pectoralis fascia).  . Osteoarthritis   . Osteoporosis   . Personal history of radiation therapy 2016   BREAST CA  . PONV (postoperative nausea and vomiting)   . SVT (supraventricular tachycardia) ([place]) 2012  . Trigeminal neuralgia     Past Surgical History:  Procedure Laterality Date  . BREAST BIOPSY Right 1985   Negative  . BREAST BIOPSY Right 03/02/2015   DCIS  . BREAST CYST ASPIRATION Right 1985   Negative  . [place]   . BREAST LUMPECTOMY Right 03/26/2015   Procedure: BREAST LUMPECTOMY;  Surgeon: Robert Bellow, MD;  Location: [facility] ORS;  Service: General;  Laterality: Right;  . BREAST MAMMOSITE Right 04/28/2015  . BUNIONECTOMY WITH HAMMERTOE RECONSTRUCTION Left 2012  . COLONOSCOPY  2013   Dr Vira Agar  . CRANIOPLASTY  2014  . [place]  2013  . VAGINAL HYSTERECTOMY  1978     Home Medications:  Prior to Admission medications   Medication Sig Start Date Jamie Belger Date Taking? Authorizing Provider  acetaminophen (TYLENOL) 500 MG tablet Take 500 mg by mouth every 6 (six) hours as needed.   Yes [provider]  alendronate (FOSAMAX) 70 MG tablet Take 70 mg by mouth once a week. Friday 10/27/15  Yes [provider]  Aspirin-Acetaminophen-Caffeine (EXCEDRIN PO) Take 1 tablet by mouth 2 (two) times daily as needed.   Yes [provider]  Biotin 5  MG TBDP Take 1 tablet by mouth daily.    Yes [provider]  cholecalciferol (VITAMIN D) 25 MCG (1000 UT) tablet Take 1 capsule by mouth daily.    Yes [provider]  Cyanocobalamin (VITAMIN B-12) 1000 MCG SUBL Take 1 tablet by mouth daily.    Yes [provider]  Flaxseed, Linseed, (FLAXSEED OIL PO) Take 1 capsule by mouth daily.   Yes [provider]  Garlic 7619 MG CAPS Take 1 capsule by mouth daily.    Yes [provider]  Glucosamine-Chondroit-Vit  C-Mn (GLUCOSAMINE CHONDR 1500 COMPLX) CAPS Take 2 capsules by mouth daily.    Yes [provider]  lamoTRIgine (LAMICTAL) 100 MG tablet Take 150 mg by mouth 2 (two) times daily.  11/21/16  Yes [provider]  Lutein 40 MG CAPS Take 1 capsule by mouth daily.    Yes [provider]  metoprolol succinate (TOPROL-XL) 50 MG 24 hr tablet Take 50 mg by mouth daily.  10/01/14  Yes [provider]  Multiple Vitamin (MULTIVITAMIN) capsule Take 1 capsule by mouth daily.   Yes [provider]  pseudoephedrine-acetaminophen (TYLENOL SINUS) 30-500 MG TABS tablet Take 1 tablet by mouth every 4 (four) hours as needed.   Yes [provider]  vitamin C (ASCORBIC ACID) 500 MG tablet Take 500 mg by mouth daily.    Yes [provider]  Coenzyme Q10 10 MG capsule Take by mouth.    [provider]  Omega-3 Fatty Acids (OMEGA-3 FISH OIL PO) Take 1,500 mg by mouth.    [provider]    Inpatient Medications: Scheduled Meds: . alum & mag hydroxide-simeth  30 mL Oral Once   And  . lidocaine  15 mL Oral Once  . aspirin EC  325 mg Oral Daily  . enoxaparin (LOVENOX) injection  40 mg Subcutaneous Q24H  . metoprolol succinate  50 mg Oral Daily  . multivitamin with minerals  1 tablet Oral Daily  . omega-3 acid ethyl esters  2,000 mg Oral Daily  . sodium chloride flush  3 mL Intravenous Once  . vitamin B-12  1,000 mcg Oral Daily  . vitamin C  500 mg Oral Daily   Continuous Infusions:  PRN Meds: acetaminophen, morphine injection, nitroGLYCERIN, ondansetron (ZOFRAN) IV  Allergies:   No Known Allergies  Social History:   Social History   Tobacco Use  . Smoking status: Never Smoker  . Smokeless tobacco: Never Used  Substance Use Topics  . Alcohol use: Yes    Alcohol/week: 7.0 standard drinks    Types: 7 Glasses of wine per week  . Drug use: No     Family History:   Family History  Problem Relation Age of Onset  . Heart disease  Father        died of heart attack and stroke at 66  . Alzheimer's disease Mother   . Breast cancer Paternal Aunt        mid 33's  . Heart disease Maternal Grandfather   . Heart disease Paternal Grandmother      ROS:  Please see the history of present illness.  All other ROS reviewed and negative.     Physical Exam/Data:   Vitals:   11/13/18 0225 11/13/18 0550 11/13/18 0700 11/13/18 0811  BP: (!) 188/88 (!) 142/96 138/75 (!) 163/88  Pulse: 95 97 72 81  Resp: 20 16    Temp: 98.2 F (36.8 C)   98.2 F (36.8 C)  TempSrc:  Oral   Oral  SpO2: 98% 99% 96% 99%  Weight:    61.4 kg  Height:    5\' 2"  (1.575 m)   No intake or output data in the 24 hours ending 11/13/18 1209 Last 3 Weights 11/13/2018 04/24/2018 01/17/2018  Weight (lbs) 135 lb 6.4 oz 138 lb 6.4 oz 138 lb 7.2 oz  Weight (kg) 61.417 kg 62.778 kg 62.8 kg     Body mass index is 24.76 kg/m.  General:  Well nourished, well developed, in no acute distress HEENT: normal Lymph: no adenopathy Neck: no JVD Endocrine:  No thryomegaly Vascular: No carotid bruits; FA pulses 2+ bilaterally without bruits  Cardiac: Regular rate and rhythm with occasional extrasystoles.  No murmurs, rubs, or gallops. Lungs:  clear to auscultation bilaterally, no wheezing, rhonchi or rales  Abd: soft, nontender, no hepatomegaly  Ext: no edema Musculoskeletal:  No deformities, BUE and BLE strength normal and equal Skin: warm and dry  Neuro:  CNs 2-12 intact, no focal abnormalities noted Psych:  Normal affect   EKG:  The EKG was personally reviewed and demonstrates: Normal sinus rhythm with poor R wave progression in V1 through V3, which may reflect lead placement or septal infarct.  Nonspecific ST segment changes are also noted. Telemetry:  Telemetry was personally reviewed and demonstrates: Normal sinus rhythm with occasional PACs.  Relevant CV Studies: None.  Laboratory Data:  High Sensitivity Troponin:  No results for input(s): TROPONINIHS in  the last 720 hours.   Cardiac Enzymes Recent Labs  Lab 11/13/18 0230 11/13/18 0815  TROPONINI <0.03 <0.03   No results for input(s): TROPIPOC in the last 168 hours.  Chemistry Recent Labs  Lab 11/13/18 0230  NA 139  K 4.0  CL 102  CO2 27  GLUCOSE 120*  BUN 23  CREATININE 0.73  CALCIUM 8.9  GFRNONAA >60  GFRAA >60  ANIONGAP 10    No results for input(s): PROT, ALBUMIN, AST, ALT, ALKPHOS, BILITOT in the last 168 hours. Hematology Recent Labs  Lab 11/13/18 0230  WBC 7.1  RBC 4.32  HGB 13.3  HCT 41.5  MCV 96.1  MCH 30.8  MCHC 32.0  RDW 14.1  PLT 243   BNPNo results for input(s): BNP, PROBNP in the last 168 hours.  DDimer No results for input(s): DDIMER in the last 168 hours.   Radiology/Studies:  Dg Chest 2 View  Result Date: 11/13/2018 CLINICAL DATA:  Chest pain EXAM: CHEST - 2 VIEW COMPARISON:  None. FINDINGS: Normal heart size and mediastinal contours. No acute infiltrate or edema. No effusion or pneumothorax. No acute osseous findings. IMPRESSION: Negative chest. Electronically Signed   By: Monte Fantasia M.D.   On: 11/13/2018 05:34    Assessment and Plan:   Chest pain: Substernal chest pain was acute in onset and woke the patient from sleep.  She describes it mostly as indigestion, with gradual resolution over 1 to 2 hours.  It is possible the nitroglycerin may have also helped some.  EKG shows nonspecific ST segment changes and possible septal infarct versus lead placement.  Cardiac risk factors include family history, age, and untreated hyperlipidemia with LDL of 186 today.  We have discussed further evaluation strategies and have agreed to perform a pharmacologic myocardial perfusion stress test.  In the meantime, I recommend continuation of aspirin though it should be reduced to 81 mg daily.  If stress test comes back normal, empiric trial of a PPI might be worthwhile.  Elevated blood pressure: Likely worsened by  acute chest pain.  Metoprolol succinate 50  mg daily can be continued, though additional pharmacotherapy may be necessary if blood pressure remains elevated.  Hyperlipidemia: LDL quite elevated at 186.  If there is evidence of CAD by aforementioned work-up, high intensity statin therapy would be indicated.  PSVT: Occasional PAC's noted on telemetry.  No sustained arrhythmia or palpitations noted.  It is reasonable to continue current dose of metoprolol.  For questions or updates, please contact [place] Please consult [email or web address] for contact info under [facility] Cardiology.  Signed, Nelva Bush, MD  11/13/2018 12:09 PM

## 2018-11-13 NOTE — Progress Notes (Signed)
   Progress Note  Patient Name: Kristin Davies Date of Encounter: 11/13/2018  Primary Cardiologist: New - Tenee Wish  Patient continues to feel well without recurrent chest pain.  Myocardial perfusion stress test performed earlier today was reviewed, demonstrating subtle mid and apical anterior/anterolateral reversible defect that may be artifact but could also represent ischemia.  Additionally, coronary artery calcification in the LAD and left main was noted.  There is also borderline transient ischemic dilation, which is nonspecific but can be seen with balanced ischemia.  I have recommended proceeding with cardiac catheterization, though the patient would like to do this on an outpatient basis.  Given negative troponin and the lack of further chest pain, I think this is reasonable.  We will discharge her with aspirin 81 mg daily and also add rosuvastatin 20 mg daily and isosorbide mononitrate 15 mg daily.  Prescription for nitroglycerin 0.4 mg sublingual every 5 minutes as needed chest pain is also recommended.  I advised [name] to call 911 if she has any recurrent chest pain.  Arrangements have been made for left heart catheterization at [facility] on 11/16/2018 at 8:30 AM.  The patient should arrive at 7 AM.  I have reviewed the risks, indications, and alternatives to cardiac catheterization, possible angioplasty, and stenting with the patient. Risks include but are not limited to bleeding, infection, vascular injury, stroke, myocardial infection, arrhythmia, kidney injury, radiation-related injury in the case of prolonged fluoroscopy use, emergency cardiac surgery, and death. The patient understands the risks of serious complication is 1-2 in 0051 with diagnostic cardiac cath and 1-2% or less with angioplasty/stenting.  For questions or updates, please contact [place] Please consult [email or web address] for contact info under [facility] Cardiology.    Signed, Nelva Bush, MD  11/13/2018, 4:49 PM

## 2018-11-14 ENCOUNTER — Encounter: Payer: Self-pay | Admitting: *Deleted

## 2018-11-14 ENCOUNTER — Telehealth: Payer: Self-pay | Admitting: Internal Medicine

## 2018-11-14 NOTE — Telephone Encounter (Signed)
Patient seen in the hospital yesterday. She was discharged at her request and scheduled to come back Friday for a cardiac cath with Dr. Saunders Revel.  I called and spoke with the patient and reviewed pre-cath instructions with her as below. She voiced understanding and was appreciative for the call back.    You are scheduled for a Cardiac Catheterization on Friday, June 26 with Dr. Harrell Gave End.  1. Please arrive at the [place] entrance at [facility]- 1 st desk on the right to check in at 7:30 am.   Special note: Every effort is made to have your procedure done on time. Please understand that emergencies sometimes delay scheduled procedures.  2. Diet: Do not eat solid foods after midnight. You may have clear liquids until 5am on the day of the procedure.  3. Labs: Done 6/23  4. Medication instructions in preparation for your procedure:  Contrast Allergy: No  On the morning of your procedure, take your Aspirin and any morning medicines NOT listed above.  You may use sips of water.   5. Plan for one night stay--bring personal belongings. 6. Bring a current list of your medications and current insurance cards. 7. You MUST have a responsible person to drive you home. 8. Someone MUST be with you the first 24 hours after you arrive home or your discharge will be delayed. 9. Please wear clothes that are easy to get on and off and wear slip-on shoes.  Thank you for allowing [place] to care for you!   -- [facility]

## 2018-11-14 NOTE — Telephone Encounter (Signed)
Patient has a cath scheduled for Friday  Would like to go over medications again before procedure  Please call to discuss

## 2018-11-16 ENCOUNTER — Ambulatory Visit
Admission: RE | Admit: 2018-11-16 | Discharge: 2018-11-16 | Disposition: A | Discharge status: Home / Self Care | Payer: Medicare Other | Attending: Internal Medicine | Admitting: Internal Medicine

## 2018-11-16 ENCOUNTER — Encounter
Admission: RE | Disposition: A | Discharge status: Home / Self Care | Payer: Medicare Other | Source: Home / Self Care | Attending: Internal Medicine

## 2018-11-16 ENCOUNTER — Other Ambulatory Visit: Payer: Self-pay

## 2018-11-16 DIAGNOSIS — Z79899 Other long term (current) drug therapy: Secondary | ICD-10-CM | POA: Diagnosis not present

## 2018-11-16 DIAGNOSIS — I10 Essential (primary) hypertension: Secondary | ICD-10-CM | POA: Diagnosis not present

## 2018-11-16 DIAGNOSIS — G500 Trigeminal neuralgia: Secondary | ICD-10-CM | POA: Diagnosis not present

## 2018-11-16 DIAGNOSIS — M1990 Unspecified osteoarthritis, unspecified site: Secondary | ICD-10-CM | POA: Insufficient documentation

## 2018-11-16 DIAGNOSIS — I25110 Atherosclerotic heart disease of native coronary artery with unstable angina pectoris: Secondary | ICD-10-CM | POA: Diagnosis present

## 2018-11-16 DIAGNOSIS — Z923 Personal history of irradiation: Secondary | ICD-10-CM | POA: Diagnosis not present

## 2018-11-16 DIAGNOSIS — M810 Age-related osteoporosis without current pathological fracture: Secondary | ICD-10-CM | POA: Insufficient documentation

## 2018-11-16 DIAGNOSIS — Z853 Personal history of malignant neoplasm of breast: Secondary | ICD-10-CM | POA: Insufficient documentation

## 2018-11-16 DIAGNOSIS — I200 Unstable angina: Secondary | ICD-10-CM

## 2018-11-16 DIAGNOSIS — Z7982 Long term (current) use of aspirin: Secondary | ICD-10-CM | POA: Insufficient documentation

## 2018-11-16 DIAGNOSIS — I471 Supraventricular tachycardia: Secondary | ICD-10-CM | POA: Diagnosis not present

## 2018-11-16 DIAGNOSIS — E785 Hyperlipidemia, unspecified: Secondary | ICD-10-CM | POA: Insufficient documentation

## 2018-11-16 HISTORY — PX: LEFT HEART CATH AND CORONARY ANGIOGRAPHY: CATH118249

## 2018-11-16 SURGERY — LEFT HEART CATH AND CORONARY ANGIOGRAPHY
Anesthesia: Moderate Sedation

## 2018-11-16 MED ORDER — VERAPAMIL HCL 2.5 MG/ML IV SOLN
INTRAVENOUS | Status: DC | PRN
  Administered 2018-11-16: 2.5 mg via INTRA_ARTERIAL

## 2018-11-16 MED ORDER — ONDANSETRON HCL 4 MG/2ML IJ SOLN: 4.0000 mg | Freq: Four times a day (QID) | INTRAMUSCULAR | Status: DC | PRN

## 2018-11-16 MED ORDER — SODIUM CHLORIDE 0.9% FLUSH: 3.0000 mL | INTRAVENOUS | Status: DC | PRN

## 2018-11-16 MED ORDER — VERAPAMIL HCL 2.5 MG/ML IV SOLN
INTRAVENOUS | Status: AC
  Filled 2018-11-16: qty 2

## 2018-11-16 MED ORDER — SODIUM CHLORIDE 0.9 % WEIGHT BASED INFUSION
3.0000 mL/kg/h | INTRAVENOUS | Status: AC
  Administered 2018-11-16: 3 mL/kg/h via INTRAVENOUS

## 2018-11-16 MED ORDER — SODIUM CHLORIDE 0.9 % IV SOLN: 250.0000 mL | INTRAVENOUS | Status: DC | PRN

## 2018-11-16 MED ORDER — MIDAZOLAM HCL 2 MG/2ML IJ SOLN
INTRAMUSCULAR | Status: DC | PRN
  Administered 2018-11-16: 1 mg via INTRAVENOUS

## 2018-11-16 MED ORDER — HEPARIN (PORCINE) IN NACL 1000-0.9 UT/500ML-% IV SOLN
INTRAVENOUS | Status: AC
  Filled 2018-11-16: qty 1000

## 2018-11-16 MED ORDER — HEPARIN (PORCINE) IN NACL 1000-0.9 UT/500ML-% IV SOLN
INTRAVENOUS | Status: DC | PRN
  Administered 2018-11-16: 500 mL

## 2018-11-16 MED ORDER — SODIUM CHLORIDE 0.9% FLUSH: 3.0000 mL | Freq: Two times a day (BID) | INTRAVENOUS | Status: DC

## 2018-11-16 MED ORDER — HYDRALAZINE HCL 20 MG/ML IJ SOLN: 10.0000 mg | INTRAMUSCULAR | Status: DC | PRN

## 2018-11-16 MED ORDER — FENTANYL CITRATE (PF) 100 MCG/2ML IJ SOLN
INTRAMUSCULAR | Status: AC
  Filled 2018-11-16: qty 2

## 2018-11-16 MED ORDER — MIDAZOLAM HCL 2 MG/2ML IJ SOLN
INTRAMUSCULAR | Status: AC
  Filled 2018-11-16: qty 2

## 2018-11-16 MED ORDER — LABETALOL HCL 5 MG/ML IV SOLN: 10.0000 mg | INTRAVENOUS | Status: DC | PRN

## 2018-11-16 MED ORDER — FENTANYL CITRATE (PF) 100 MCG/2ML IJ SOLN
INTRAMUSCULAR | Status: DC | PRN
  Administered 2018-11-16: 25 ug via INTRAVENOUS

## 2018-11-16 MED ORDER — ACETAMINOPHEN 325 MG PO TABS: 650.0000 mg | ORAL_TABLET | ORAL | Status: DC | PRN

## 2018-11-16 MED ORDER — HEPARIN SODIUM (PORCINE) 1000 UNIT/ML IJ SOLN
INTRAMUSCULAR | Status: DC | PRN
  Administered 2018-11-16: 3000 [IU] via INTRAVENOUS

## 2018-11-16 MED ORDER — SODIUM CHLORIDE 0.9 % IV SOLN: INTRAVENOUS | Status: DC

## 2018-11-16 MED ORDER — SODIUM CHLORIDE 0.9 % WEIGHT BASED INFUSION: 1.0000 mL/kg/h | INTRAVENOUS | Status: DC

## 2018-11-16 MED ORDER — HEPARIN SODIUM (PORCINE) 1000 UNIT/ML IJ SOLN
INTRAMUSCULAR | Status: AC
  Filled 2018-11-16: qty 1

## 2018-11-16 MED ORDER — IOHEXOL 300 MG/ML  SOLN
INTRAMUSCULAR | Status: DC | PRN
  Administered 2018-11-16: 70 mL via INTRA_ARTERIAL

## 2018-11-16 MED ORDER — ASPIRIN 81 MG PO CHEW: 81.0000 mg | CHEWABLE_TABLET | ORAL | Status: DC

## 2018-11-16 SURGICAL SUPPLY — 7 items
CATH 5F 110X4 TIG (CATHETERS) ×2 IMPLANT
CATH INFINITI 5FR ANG PIGTAIL (CATHETERS) ×2 IMPLANT
DEVICE RAD TR BAND REGULAR (VASCULAR PRODUCTS) ×2 IMPLANT
GLIDESHEATH SLEND SS 6F .021 (SHEATH) ×2 IMPLANT
KIT MANI 3VAL PERCEP (MISCELLANEOUS) ×3 IMPLANT
PACK CARDIAC CATH (CUSTOM PROCEDURE TRAY) ×3 IMPLANT
WIRE ROSEN-J .035X260CM (WIRE) ×2 IMPLANT

## 2018-11-16 NOTE — Progress Notes (Signed)
Dr. Saunders Revel at bedside, speaking with pt. MD spoke with pt. Spouse, Mr. Collene Leyden. RN spoke with pt. On phone re: post procedural instructions.

## 2018-11-16 NOTE — Interval H&P Note (Signed)
History and Physical Interval Note:  11/16/2018 8:46 AM  Kristin Davies  has presented today for cardiac catheterization, with the diagnosis of unstable angina.  The various methods of treatment have been discussed with the patient and family. After consideration of risks, benefits and other options for treatment, the patient has consented to  Procedure(s): LEFT HEART CATH AND CORONARY ANGIOGRAPHY (N/A) as a surgical intervention.  The patient's history has been reviewed, patient examined, no change in status, stable for surgery.  I have reviewed the patient's chart and labs.  Questions were answered to the patient's satisfaction.    Cath Lab Visit (complete for each Cath Lab visit)  Clinical Evaluation Leading to the Procedure:   ACS: Yes.    Non-ACS:  N/A  [name]

## 2018-11-29 NOTE — Progress Notes (Signed)
Cardiology Office Note Date:  12/03/2018  Patient ID:  Kristin Davies 1946-05-18, MRN [id number] PCP:  Kirk Ruths, MD  Cardiologist:  Dr. Saunders Revel, MD    Chief Complaint: Hospital follow-up  History of Present Illness: Kristin Davies is a 73 y.o. female with history of mild to moderate nonobstructive CAD by LHC on 11/16/2018, paroxysmal SVT, hyperlipidemia, meningioma, DCIS of the breast, and trigeminal neuralgia who presents for diagnostic cardiac cath follow-up.  Patient previously underwent exercise tolerance test in 2007 while living in [place] for an episode of chest pain that was reportedly normal.  She has been managed on metoprolol for several years through her PCP for paroxysmal SVT.  She was recently admitted to the hospital on 11/13/2018 with chest pain that woke her up around 1 AM that morning which she described as indigestion and radiated upwards towards the left shoulder and neck.  Blood pressure was noted to be elevated ranging from the 510C to 585I systolic.  EKG showed nonspecific ST-T changes with possible septal infarct versus lead placement.  Troponin negative x4.  She underwent Lexiscan Myoview that showed a moderate in size, mild in severity, reversible defect involving the mid and apical anterior/anterolateral segments concerning for ischemia though artifact could not be excluded (breast attenuation and misregistration).  LV systolic function was normal at greater than 65%.  Borderline transient ischemic dilation was noted.  Attenuation corrected CT images demonstrated coronary artery and aortic atherosclerotic calcification.  Overall this was an abnormal, potentially high risk pharmacologic myocardial perfusion stress test.  During her admission she continued to feel well.  Cardiac catheterization was recommended however the patient preferred to do this as an outpatient.  In this setting, she underwent diagnostic cath on 11/16/2018 which showed mild to moderate,  nonobstructive CAD with proximal LAD calcified 20% stenosis, mid LAD 30% stenosis, D1 40% stenosis, proximal LCx 50% stenosis, mid RCA focal and eccentric 50% stenosis.  The LV systolic function and LVEDP were normal.  She comes in doing well from a cardiac perspective.  No chest pain, shortness of breath, palpitations, dizziness, presyncope, or syncope.  No lower extremity swelling, abdominal distention, orthopnea, PND, early satiety.  No falls, BRBPR, or melena.  She does feel like she is more in tune with her body and feels like she is in a better spot overall following her catheterization.  She is compliant with all medication.  She has not significantly improved her diet.  She has not had any complications from the right radial cardiac cath site.  Labs: 10/2018 - TC 293, TG 91, HDL 89, LDL 186, Hgb 13.3, PLT 243, potassium 4.0, serum creatinine 0.73, glucose 120 02/2018 - TSH normal   Past Medical History:  Diagnosis Date   Arthritis    Breast cancer ([place]) 2016   DCIS, ER negative, PR negative, high-grade. 3.5 cm. 1 mm deep margin (pectoralis fascia). MammoSite radiation.   Cancer ([place]) 03/26/2015   DCIS, ER negative, PR negative, high-grade. 3.5 cm. 1 mm deep margin (pectoralis fascia).   Osteoarthritis    Osteoporosis    Personal history of radiation therapy 2016   BREAST CA   PONV (postoperative nausea and vomiting)    SVT (supraventricular tachycardia) ([place]) 2012   Trigeminal neuralgia     Past Surgical History:  Procedure Laterality Date   BREAST BIOPSY Right 1985   Negative   BREAST BIOPSY Right 03/02/2015   DCIS   BREAST CYST ASPIRATION Right 1985   Negative   BREAST  [place]    BREAST LUMPECTOMY Right 03/26/2015   Procedure: BREAST LUMPECTOMY;  Surgeon: Robert Bellow, MD;  Location: [facility]S;  Service: General;  Laterality: Right;   BREAST MAMMOSITE Right 04/28/2015   BUNIONECTOMY WITH HAMMERTOE RECONSTRUCTION  Left 2012   COLONOSCOPY  2013   Dr Vira Agar   CRANIOPLASTY  2014   [place]  2013   LEFT HEART CATH AND CORONARY ANGIOGRAPHY N/A 11/16/2018   Procedure: LEFT HEART CATH AND CORONARY ANGIOGRAPHY;  Surgeon: Nelva Bush, MD;  Location: [facility];  Service: Cardiovascular;  Laterality: N/A;   VAGINAL HYSTERECTOMY  1978    Current Meds  Medication Sig   acetaminophen (TYLENOL) 500 MG tablet Take 500 mg by mouth every 6 (six) hours as needed.   alendronate (FOSAMAX) 70 MG tablet Take 70 mg by mouth once a week. Friday   aspirin EC 81 MG EC tablet Take 1 tablet (81 mg total) by mouth daily.   Biotin 5 MG TBDP Take 1 tablet by mouth daily.    cholecalciferol (VITAMIN D) 25 MCG (1000 UT) tablet Take 5,000 Units by mouth daily.    Cyanocobalamin (VITAMIN B-12) 1000 MCG SUBL Take 1 tablet by mouth daily.    Flaxseed, Linseed, (FLAXSEED OIL PO) Take 1 capsule by mouth daily.   Garlic 6073 MG CAPS Take 1 capsule by mouth daily.    Glucosamine-Chondroit-Vit C-Mn (GLUCOSAMINE CHONDR 1500 COMPLX) CAPS Take 2 capsules by mouth daily.    isosorbide mononitrate (IMDUR) 30 MG 24 hr tablet Take 0.5 tablets (15 mg total) by mouth daily.   lamoTRIgine (LAMICTAL) 100 MG tablet Take 150 mg by mouth 2 (two) times daily.    Lutein 40 MG CAPS Take 1 capsule by mouth daily.    metoprolol succinate (TOPROL-XL) 50 MG 24 hr tablet Take 50 mg by mouth daily.    Multiple Vitamin (MULTIVITAMIN) capsule Take 1 capsule by mouth daily.   nitroGLYCERIN (NITROSTAT) 0.4 MG SL tablet Place 1 tablet (0.4 mg total) under the tongue every 5 (five) minutes as needed for chest pain.   Omega-3 Fatty Acids (OMEGA-3 FISH OIL PO) Take 1,500 mg by mouth.   predniSONE (DELTASONE) 10 MG tablet Take 10 mg by mouth as directed.   psyllium (REGULOID) 0.52 g capsule Take 2,625 mg by mouth daily.   pyridOXINE (VITAMIN B-6) 50 MG tablet Take 50 mg by mouth daily.   rosuvastatin (CRESTOR) 20  MG tablet Take 1 tablet (20 mg total) by mouth daily at 6 PM.   vitamin C (ASCORBIC ACID) 500 MG tablet Take 500 mg by mouth daily.     Allergies:   Patient has no known allergies.   Social History:  The patient  reports that she has never smoked. She has never used smokeless tobacco. She reports current alcohol use of about 7.0 standard drinks of alcohol per week. She reports that she does not use drugs.   Family History:  The patient's family history includes Alzheimer's disease in her mother; Breast cancer in her paternal aunt; Heart disease in her father, maternal grandfather, and paternal grandmother.  ROS:   Review of Systems  Constitutional: Negative for chills, diaphoresis, fever, malaise/fatigue and weight loss.  HENT: Negative for congestion.   Eyes: Negative for discharge and redness.  Respiratory: Negative for cough, hemoptysis, sputum production, shortness of breath and wheezing.   Cardiovascular: Negative for chest pain, palpitations, orthopnea, claudication, leg swelling and PND.  Gastrointestinal: Negative for  abdominal pain, blood in stool, heartburn, melena, nausea and vomiting.  Genitourinary: Negative for hematuria.  Musculoskeletal: Negative for falls and myalgias.  Skin: Negative for rash.  Neurological: Negative for dizziness, tingling, tremors, sensory change, speech change, focal weakness, loss of consciousness and weakness.  Endo/Heme/Allergies: Does not bruise/bleed easily.  Psychiatric/Behavioral: Negative for substance abuse. The patient is not nervous/anxious.   All other systems reviewed and are negative.    PHYSICAL EXAM:  VS:  BP 120/80 (BP Location: Left Arm, Patient Position: Sitting, Cuff Size: Normal)    Pulse 78    Temp (!) 97.3 F (36.3 C)    Ht 5\' 2"  (1.575 m)    Wt 139 lb (63 kg)    SpO2 98%    BMI 25.42 kg/m  BMI: Body mass index is 25.42 kg/m.  Physical Exam  Constitutional: She is oriented to person, place, and time. She appears  well-developed and well-nourished.  HENT:  Head: Normocephalic and atraumatic.  Eyes: Right eye exhibits no discharge. Left eye exhibits no discharge.  Neck: Normal range of motion. No JVD present.  Cardiovascular: Normal rate, regular rhythm, S1 normal, S2 normal and normal heart sounds. Exam reveals no distant heart sounds, no friction rub, no midsystolic click and no opening snap.  No murmur heard. Pulses:      Posterior tibial pulses are 2+ on the right side and 2+ on the left side.  Right radial cardiac cath site is is well-healing without any active bleeding, erythema, warmth, swelling, or tenderness to palpation.  There is minimal resolving bruising.  Radial pulse 2+.  Pulmonary/Chest: Effort normal and breath sounds normal. No respiratory distress. She has no decreased breath sounds. She has no wheezes. She has no rales. She exhibits no tenderness.  Abdominal: Soft. She exhibits no distension. There is no abdominal tenderness.  Musculoskeletal:        General: No edema.  Neurological: She is alert and oriented to person, place, and time.  Skin: Skin is warm and dry. No cyanosis. Nails show no clubbing.  Psychiatric: She has a normal mood and affect. Her speech is normal and behavior is normal. Judgment and thought content normal.     EKG:  Was ordered and interpreted by me today. Shows NSR, 78 bpm, poor R wave progression along the precordial leads, nonspecific ST-T changes (grossly unchanged from prior)  Recent Labs: 11/13/2018: BUN 23; Creatinine, Ser 0.73; Hemoglobin 13.3; Platelets 243; Potassium 4.0; Sodium 139  11/13/2018: Cholesterol 293; HDL 89; LDL Cholesterol 186; Total CHOL/HDL Ratio 3.3; Triglycerides 91; VLDL 18   Estimated Creatinine Clearance: 54.7 mL/min (by C-G formula based on SCr of 0.73 mg/dL).   Wt Readings from Last 3 Encounters:  12/03/18 139 lb (63 kg)  11/16/18 133 lb (60.3 kg)  11/13/18 135 lb 6.4 oz (61.4 kg)     Other studies reviewed: Additional  studies/records reviewed today include: summarized above  ASSESSMENT AND PLAN:  1. Nonobstructive CAD without angina/aortic atherosclerosis: Recent cardiac cath on 11/16/2018 demonstrating nonobstructive disease as above.  Currently without symptoms consistent with angina.  Recommend aggressive risk factor modification and primary prevention.  Recently started on statin as outlined below.  Continue aspirin, Imdur, Toprol, and Crestor.  Post-cath instructions.  2. Paroxysmal SVT: This appears to date back to 2014 with prior cardiac monitoring unavailable for review.  Continue Toprol-XL as above.  3. Hypertension: Blood pressure is well controlled today.  Continue Imdur and Toprol.  4. Hyperlipidemia: LDL of 186 from 10/2018.  Previously not  on statin.  Currently on Crestor following recent admission as above.  Recommend rechecking fasting lipid panel and liver function in approximately 8 weeks with a goal LDL less than 70.  Disposition: F/u with Dr. Saunders Revel or an APP in 8 weeks.  Current medicines are reviewed at length with the patient today.  The patient did not have any concerns regarding medicines.  Signed, Christell Faith, PA-C 12/03/2018 9:46 AM     [address] [place], [place] [address] [phone]

## 2018-11-30 ENCOUNTER — Telehealth: Payer: Self-pay

## 2018-11-30 NOTE — Telephone Encounter (Signed)

## 2018-12-03 ENCOUNTER — Encounter: Payer: Self-pay | Admitting: Physician Assistant

## 2018-12-03 ENCOUNTER — Other Ambulatory Visit: Payer: Self-pay

## 2018-12-03 ENCOUNTER — Ambulatory Visit: Payer: Medicare Other | Admitting: Physician Assistant

## 2018-12-03 VITALS — BP 120/80 | HR 78 | Temp 97.3°F | Ht 62.0 in | Wt 139.0 lb

## 2018-12-03 DIAGNOSIS — I471 Supraventricular tachycardia: Secondary | ICD-10-CM

## 2018-12-03 DIAGNOSIS — I10 Essential (primary) hypertension: Secondary | ICD-10-CM

## 2018-12-03 DIAGNOSIS — I700 Atherosclerosis of aorta: Secondary | ICD-10-CM

## 2018-12-03 DIAGNOSIS — I2510 Atherosclerotic heart disease of native coronary artery without angina pectoris: Secondary | ICD-10-CM | POA: Diagnosis not present

## 2018-12-03 DIAGNOSIS — E785 Hyperlipidemia, unspecified: Secondary | ICD-10-CM | POA: Diagnosis not present

## 2018-12-03 NOTE — Patient Instructions (Signed)
Medication Instructions:  - Your physician recommends that you continue on your current medications as directed. Please refer to the Current Medication list given to you today.  If you need a refill on your cardiac medications before your next appointment, please call your pharmacy.   Lab work: . - Your physician recommends that you return for FASTING lab work in: 8 weeks (same day as your appointment with Thurmond Butts), do not eat/ drink anything for 8 hours prior to your appointment except for water or black coffee. - Lipid/ Liver   If you have labs (blood work) drawn today and your tests are completely normal, you will receive your results only by: Marland Kitchen MyChart Message (if you have MyChart) OR . A paper copy in the mail If you have any lab test that is abnormal or we need to change your treatment, we will call you to review the results.  Testing/Procedures: - none ordered  Follow-Up: At [facility], you and your health needs are our priority.  As part of our continuing mission to provide you with exceptional heart care, we have created designated Provider Care Teams.  These Care Teams include your primary Cardiologist (physician) and Advanced Practice Providers (APPs -  Physician Assistants and Nurse Practitioners) who all work together to provide you with the care you need, when you need it. . 8 weeks with Christell Faith, PA (you will have FASTING labs the same day), do not eat/ drink anything for 8 hours prior to your appointment except for water or black coffee  Any Other Special Instructions Will Be Listed Below (If Applicable). - N/A

## 2018-12-04 ENCOUNTER — Other Ambulatory Visit: Payer: Self-pay | Admitting: Neurology

## 2018-12-04 DIAGNOSIS — D329 Benign neoplasm of meninges, unspecified: Secondary | ICD-10-CM

## 2018-12-11 ENCOUNTER — Encounter: Payer: Self-pay | Admitting: General Surgery

## 2018-12-17 ENCOUNTER — Ambulatory Visit
Admission: RE | Admit: 2018-12-17 | Discharge: 2018-12-17 | Disposition: A | Discharge status: Home / Self Care | Payer: Medicare Other | Source: Ambulatory Visit | Attending: Neurology | Admitting: Neurology

## 2018-12-17 ENCOUNTER — Other Ambulatory Visit: Payer: Self-pay

## 2018-12-17 DIAGNOSIS — D329 Benign neoplasm of meninges, unspecified: Secondary | ICD-10-CM | POA: Diagnosis not present

## 2018-12-17 MED ORDER — GADOBUTROL 1 MMOL/ML IV SOLN
6.0000 mL | Freq: Once | INTRAVENOUS | Status: AC | PRN
  Administered 2018-12-17: 6 mL via INTRAVENOUS

## 2019-01-01 MED ORDER — METOPROLOL SUCCINATE ER 50 MG PO TB24: 50.0000 mg | ORAL_TABLET | Freq: Every day | ORAL | 3 refills | Status: DC

## 2019-01-07 ENCOUNTER — Other Ambulatory Visit: Payer: Self-pay

## 2019-01-07 MED ORDER — METOPROLOL SUCCINATE ER 50 MG PO TB24: 50.0000 mg | ORAL_TABLET | Freq: Every day | ORAL | 0 refills | Status: DC

## 2019-01-07 MED ORDER — ROSUVASTATIN CALCIUM 20 MG PO TABS: 20.0000 mg | ORAL_TABLET | Freq: Every day | ORAL | 0 refills | Status: DC

## 2019-01-07 MED ORDER — ISOSORBIDE MONONITRATE ER 30 MG PO TB24: 15.0000 mg | ORAL_TABLET | Freq: Every day | ORAL | 0 refills | Status: DC

## 2019-01-15 ENCOUNTER — Other Ambulatory Visit: Payer: Self-pay

## 2019-01-15 DIAGNOSIS — D0511 Intraductal carcinoma in situ of right breast: Secondary | ICD-10-CM

## 2019-01-23 ENCOUNTER — Ambulatory Visit: Payer: Medicare Other | Admitting: Radiation Oncology

## 2019-01-28 NOTE — Progress Notes (Signed)
Cardiology Office Note    Date:  01/31/2019   ID:  Kristin Davies, DOB Jun 27, 1945, MRN [id number]  PCP:  Kristin Elk, MD  Cardiologist:  Kristin Bush, MD  Electrophysiologist:  None   Chief Complaint: Follow up  History of Present Illness:   Kristin Davies is a 73 y.o. female with history of mild to moderate nonobstructive CAD by LHC on 11/16/2018, paroxysmal SVT, hyperlipidemia, meningioma, DCIS of the breast, and trigeminal neuralgia who presents for follow up of HLD.   Patient previously underwent exercise tolerance test in 2007 while living in [place] for an episode of chest pain that was reportedly normal.  She has been managed on metoprolol for several years through her PCP for paroxysmal SVT.  She was admitted to the hospital on 11/13/2018 with chest pain that woke her up around 1 AM that morning which she described as indigestion and radiated upwards towards the left shoulder and neck.  Blood pressure was noted to be elevated ranging from the [id number] to [id number] systolic.  EKG showed nonspecific ST-T changes with possible septal infarct versus lead placement.  Troponin negative x4.  She underwent Lexiscan Myoview that showed a moderate in size, mild in severity, reversible defect involving the mid and apical anterior/anterolateral segments concerning for ischemia though artifact could not be excluded (breast attenuation and misregistration).  LV systolic function was normal at greater than 65%.  Borderline transient ischemic dilation was noted.  Attenuation corrected CT images demonstrated coronary artery and aortic atherosclerotic calcification.  Overall, this was an abnormal, potentially high risk pharmacologic myocardial perfusion stress test.  During her admission she continued to feel well.  Cardiac catheterization was recommended however the patient preferred to do this as an outpatient.  In this setting, she underwent diagnostic cath on 11/16/2018 which showed mild to moderate,  nonobstructive CAD with proximal LAD calcified 20% stenosis, mid LAD 30% stenosis, D1 40% stenosis, proximal LCx 50% stenosis, mid RCA focal and eccentric 50% stenosis.  The LV systolic function and LVEDP were normal.  She was seen in follow up on 12/03/2018 and was doing well.    She comes in doing very well today.  She denies any chest pain, shortness of breath, palpitations, dizziness, presyncope, or syncope.  No lower extremity swelling, abdominal distention, orthopnea, PND, early satiety.  No falls since she was last seen.  She is tolerating all medications without issues.  Blood pressures typically running in the 1 teens to [id number] systolic.  She is fasting today for her blood work.  Since starting cardiac medication she also notes significant improvement in headaches.  She is walking on a daily basis without symptoms and feels great with this.   Labs: 10/2018 - TC 293, TG 91, HDL 89, LDL 186, Hgb 13.3, PLT 243, potassium 4.0, serum creatinine 0.73, glucose 120 02/2018 - TSH normal  Past Medical History:  Diagnosis Date   Arthritis    Breast cancer ([place]) 2016   DCIS, ER negative, PR negative, high-grade. 3.5 cm. 1 mm deep margin (pectoralis fascia). MammoSite radiation.   Cancer ([place]) 03/26/2015   DCIS, ER negative, PR negative, high-grade. 3.5 cm. 1 mm deep margin (pectoralis fascia).   Osteoarthritis    Osteoporosis    Personal history of radiation therapy 2016   BREAST CA   PONV (postoperative nausea and vomiting)    SVT (supraventricular tachycardia) ([place]) 2012   Trigeminal neuralgia     Past Surgical History:  Procedure Laterality Date   BREAST BIOPSY  Right 1985   Negative   BREAST BIOPSY Right 03/02/2015   DCIS   BREAST CYST ASPIRATION Right 1985   Negative   BREAST FIBROADENOMA SURGERY Right 1982   [place]    BREAST LUMPECTOMY Right 03/26/2015   Procedure: BREAST LUMPECTOMY;  Surgeon: Robert Bellow, MD;  Location: [facility] ORS;  Service: General;   Laterality: Right;   BREAST MAMMOSITE Right 04/28/2015   BUNIONECTOMY WITH HAMMERTOE RECONSTRUCTION Left 2012   COLONOSCOPY  2013   Dr Vira Agar   CRANIOPLASTY  2014   [place]  2013   LEFT HEART CATH AND CORONARY ANGIOGRAPHY N/A 11/16/2018   Procedure: LEFT HEART CATH AND CORONARY ANGIOGRAPHY;  Surgeon: Kristin Bush, MD;  Location: [facility];  Service: Cardiovascular;  Laterality: N/A;   VAGINAL HYSTERECTOMY  1978    Current Medications: Current Meds  Medication Sig   acetaminophen (TYLENOL) 500 MG tablet Take 500 mg by mouth every 6 (six) hours as needed.   alendronate (FOSAMAX) 70 MG tablet Take 70 mg by mouth once a week. Friday   aspirin EC 81 MG EC tablet Take 1 tablet (81 mg total) by mouth daily.   Biotin 5 MG TBDP Take 1 tablet by mouth daily.    cholecalciferol (VITAMIN D) 25 MCG (1000 UT) tablet Take 5,000 Units by mouth daily.    Cyanocobalamin (VITAMIN B-12) 1000 MCG SUBL Take 1 tablet by mouth daily.    Flaxseed, Linseed, (FLAXSEED OIL PO) Take 1 capsule by mouth daily.   Glucosamine-Chondroit-Vit C-Mn (GLUCOSAMINE CHONDR 1500 COMPLX) CAPS Take 2 capsules by mouth daily.    isosorbide mononitrate (IMDUR) 30 MG 24 hr tablet Take 0.5 tablets (15 mg total) by mouth daily.   lamoTRIgine (LAMICTAL) 100 MG tablet Take 150 mg by mouth 2 (two) times daily.    Lutein 40 MG CAPS Take 1 capsule by mouth daily.    metoprolol succinate (TOPROL-XL) 50 MG 24 hr tablet Take 1 tablet (50 mg total) by mouth daily.   Multiple Vitamin (MULTIVITAMIN) capsule Take 1 capsule by mouth daily.   nitroGLYCERIN (NITROSTAT) 0.4 MG SL tablet Place 1 tablet (0.4 mg total) under the tongue every 5 (five) minutes as needed for chest pain.   NON FORMULARY Take 700 mg by mouth daily. Garlic and ginger   psyllium (REGULOID) 0.52 g capsule Take 2,625 mg by mouth daily.   pyridOXINE (VITAMIN B-6) 50 MG tablet Take 50 mg by mouth daily.   rosuvastatin  (CRESTOR) 20 MG tablet Take 1 tablet (20 mg total) by mouth daily at 6 PM.   vitamin C (ASCORBIC ACID) 500 MG tablet Take 500 mg by mouth daily.     Allergies:   Patient has no known allergies.   Social History   Socioeconomic History   Marital status: Married    Spouse name: Joe   Number of children: 3   Years of education: Not on file   Highest education level: Not on file  Occupational History   Not on file  Social Needs   Financial resource strain: Not very hard   Food insecurity    Worry: Never true    Inability: Never true   Transportation needs    Medical: No    Non-medical: No  Tobacco Use   Smoking status: Never Smoker   Smokeless tobacco: Never Used  Substance and Sexual Activity   Alcohol use: Yes    Alcohol/week: 7.0 standard drinks    Types: 7 Glasses of wine per week  Drug use: No   Sexual activity: Not on file  Lifestyle   Physical activity    Days per week: 4 days    Minutes per session: 40 min   Stress: Only a little  Relationships   Social connections    Talks on phone: More than three times a week    Gets together: Not on file    Attends religious service: Not on file    Active member of club or organization: Not on file    Attends meetings of clubs or organizations: Not on file    Relationship status: Not on file  Other Topics Concern   Not on file  Social History Narrative   Not on file     Family History:  The patient's family history includes Alzheimer's disease in her mother; Breast cancer in her paternal aunt; Heart disease in her father, maternal grandfather, and paternal grandmother.  ROS:   Review of Systems  Constitutional: Negative for chills, diaphoresis, fever, malaise/fatigue and weight loss.  HENT: Negative for congestion.   Eyes: Negative for discharge and redness.  Respiratory: Negative for cough, hemoptysis, sputum production, shortness of breath and wheezing.   Cardiovascular: Negative for chest  pain, palpitations, orthopnea, claudication, leg swelling and PND.  Gastrointestinal: Negative for abdominal pain, blood in stool, heartburn, melena, nausea and vomiting.  Genitourinary: Negative for hematuria.  Musculoskeletal: Negative for falls and myalgias.  Skin: Negative for rash.  Neurological: Negative for dizziness, tingling, tremors, sensory change, speech change, focal weakness, loss of consciousness and weakness.  Endo/Heme/Allergies: Does not bruise/bleed easily.  Psychiatric/Behavioral: Negative for substance abuse. The patient is not nervous/anxious.   All other systems reviewed and are negative.    EKGs/Labs/Other Studies Reviewed:    Studies reviewed were summarized above. The additional studies were reviewed today:  LHC 10/2018: Conclusions: 1. Mild to moderate, non-obstructive coronary artery disease. 2. Normal left ventricular systolic function and filling pressure.  Recommendations: 1. Continue current medical therapy and risk factor modification. 2. Follow-up in the office in ~2 weeks.   EKG:  EKG is ordered today.  The EKG ordered today demonstrates NSR, 73 bpm, occasional PACs, low voltage QRS, nonspecific ST-T changes (grossly unchanged from prior  Recent Labs: 11/13/2018: BUN 23; Creatinine, Ser 0.73; Hemoglobin 13.3; Platelets 243; Potassium 4.0; Sodium 139  Recent Lipid Panel    Component Value Date/Time   CHOL 293 (H) 11/13/2018 0815   TRIG 91 11/13/2018 0815   HDL 89 11/13/2018 0815   CHOLHDL 3.3 11/13/2018 0815   VLDL 18 11/13/2018 0815   LDLCALC 186 (H) 11/13/2018 0815    PHYSICAL EXAM:    VS:  BP 130/70 (BP Location: Left Arm, Patient Position: Lying right side, Cuff Size: Normal)    Pulse 73    Ht 5\' 2"  (1.575 m)    Wt 131 lb (59.4 kg)    BMI 23.96 kg/m   BMI: Body mass index is 23.96 kg/m.  Physical Exam  Constitutional: She is oriented to person, place, and time. She appears well-developed and well-nourished.  HENT:  Head:  Normocephalic and atraumatic.  Eyes: Right eye exhibits no discharge. Left eye exhibits no discharge.  Neck: Normal range of motion. No JVD present.  Cardiovascular: Normal rate, regular rhythm, S1 normal, S2 normal and normal heart sounds. Exam reveals no distant heart sounds, no friction rub, no midsystolic click and no opening snap.  No murmur heard. Pulses:      Posterior tibial pulses are 2+ on the right side  and 2+ on the left side.  Pulmonary/Chest: Effort normal and breath sounds normal. No respiratory distress. She has no decreased breath sounds. She has no wheezes. She has no rales. She exhibits no tenderness.  Abdominal: Soft. She exhibits no distension. There is no abdominal tenderness.  Musculoskeletal:        General: No edema.  Neurological: She is alert and oriented to person, place, and time.  Skin: Skin is warm and dry. No cyanosis. Nails show no clubbing.  Psychiatric: She has a normal mood and affect. Her speech is normal and behavior is normal. Judgment and thought content normal.    Wt Readings from Last 3 Encounters:  01/31/19 131 lb (59.4 kg)  12/03/18 139 lb (63 kg)  11/16/18 133 lb (60.3 kg)     ASSESSMENT & PLAN:   1. Nonobstructive CAD without angina/aortic atherosclerosis: She is doing well without any symptoms concerning for angina.  Recent cardiac cath in 10/2018 demonstrated nonobstructive disease as above.  Continue aggressive resector modification and primary prevention with aspirin, Crestor, Toprol, and Imdur.  2. Paroxysmal SVT: She denies any symptom concerning for palpitations.  She is noted to have occasional PACs on twelve-lead EKG and rhythm strip this morning.  Continue current dose of Toprol-XL.  3. HTN: Blood pressure is mildly elevated today though typically runs in the 1 teens to [id number] systolic.  No changes were made at this time.  Continue current dose of Imdur and Toprol-XL.  4. HLD: LDL of 186 from 10/2018 (not on a statin at that time).   Now on Crestor 20 mg and tolerating.  Goal LDL < 70.  Check fasting lipid panel and liver function.   Disposition: F/u with Dr. Saunders Revel or an APP in 6 months.   Medication Adjustments/Labs and Tests Ordered: Current medicines are reviewed at length with the patient today.  Concerns regarding medicines are outlined above. Medication changes, Labs and Tests ordered today are summarized above and listed in the Patient Instructions accessible in Encounters.   Signed, Christell Faith, PA-C 01/31/2019 8:04 AM     [facility] [address] [place] [address] [place], [place] [address] [phone]

## 2019-01-31 ENCOUNTER — Other Ambulatory Visit: Payer: Self-pay

## 2019-01-31 ENCOUNTER — Ambulatory Visit: Payer: Medicare Other | Admitting: Physician Assistant

## 2019-01-31 ENCOUNTER — Encounter: Payer: Self-pay | Admitting: Physician Assistant

## 2019-01-31 ENCOUNTER — Telehealth: Payer: Self-pay

## 2019-01-31 ENCOUNTER — Other Ambulatory Visit
Admission: RE | Admit: 2019-01-31 | Discharge: 2019-01-31 | Disposition: A | Discharge status: Home / Self Care | Payer: Medicare Other | Source: Ambulatory Visit | Attending: Physician Assistant | Admitting: Physician Assistant

## 2019-01-31 VITALS — BP 130/70 | HR 73 | Ht 62.0 in | Wt 131.0 lb

## 2019-01-31 DIAGNOSIS — I700 Atherosclerosis of aorta: Secondary | ICD-10-CM | POA: Diagnosis not present

## 2019-01-31 DIAGNOSIS — E785 Hyperlipidemia, unspecified: Secondary | ICD-10-CM

## 2019-01-31 DIAGNOSIS — I10 Essential (primary) hypertension: Secondary | ICD-10-CM

## 2019-01-31 DIAGNOSIS — I471 Supraventricular tachycardia: Secondary | ICD-10-CM | POA: Diagnosis not present

## 2019-01-31 DIAGNOSIS — I2510 Atherosclerotic heart disease of native coronary artery without angina pectoris: Secondary | ICD-10-CM | POA: Diagnosis not present

## 2019-01-31 LAB — HEPATIC FUNCTION PANEL
ALT: 28 U/L (ref 0–44)
AST: 31 U/L (ref 15–41)
Albumin: 4.3 g/dL (ref 3.5–5.0)
Alkaline Phosphatase: 49 U/L (ref 38–126)
Bilirubin, Direct: 0.1 mg/dL (ref 0.0–0.2)
Indirect Bilirubin: 0.5 mg/dL (ref 0.3–0.9)
Total Bilirubin: 0.6 mg/dL (ref 0.3–1.2)
Total Protein: 6.6 g/dL (ref 6.5–8.1)

## 2019-01-31 LAB — LIPID PANEL
Cholesterol: 164 mg/dL (ref 0–200)
HDL: 82 mg/dL (ref >40)
LDL Cholesterol: 66 mg/dL (ref 0–99)
Total CHOL/HDL Ratio: 2 RATIO
Triglycerides: 78 mg/dL (ref <150)
VLDL: 16 mg/dL (ref 0–40)

## 2019-01-31 NOTE — Patient Instructions (Signed)
Medication Instructions:  Your physician recommends that you continue on your current medications as directed. Please refer to the Current Medication list given to you today.  If you need a refill on your cardiac medications before your next appointment, please call your pharmacy.   Lab work: Your physician recommends that you return for lab work in: TODAY (LIPID, LIVER).  If you have labs (blood work) drawn today and your tests are completely normal, you will receive your results only by: Marland Kitchen MyChart Message (if you have MyChart) OR . A paper copy in the mail If you have any lab test that is abnormal or we need to change your treatment, we will call you to review the results.  Testing/Procedures: NONE  Follow-Up: At [facility], you and your health needs are our priority.  As part of our continuing mission to provide you with exceptional heart care, we have created designated Provider Care Teams.  These Care Teams include your primary Cardiologist (physician) and Advanced Practice Providers (APPs -  Physician Assistants and Nurse Practitioners) who all work together to provide you with the care you need, when you need it. You will need a follow up appointment in 6 months.  Please call our office 2 months in advance to schedule this appointment.  You may see Nelva Bush, MD or one of the following Advanced Practice Providers on your designated Care Team:   Murray Hodgkins, NP Christell Faith, PA-C . Marrianne Mood, PA-C

## 2019-01-31 NOTE — Telephone Encounter (Signed)
-----   Message from Rise Mu, PA-C sent at 01/31/2019 10:11 AM EDT ----- Please contact patient. Cholesterol is much improved with LDL now at 66 with prior of 186. Liver function is normal. Great news. No changes in medications.

## 2019-01-31 NOTE — Telephone Encounter (Signed)
Call to patient to review recent lab results. Pt very pleased with results. No new orders at this time.   Advised pt to call for any further questions or concerns.

## 2019-02-11 MED ORDER — ISOSORBIDE MONONITRATE ER 30 MG PO TB24: 15.0000 mg | ORAL_TABLET | Freq: Every day | ORAL | 1 refills | Status: DC

## 2019-02-18 ENCOUNTER — Other Ambulatory Visit: Payer: Self-pay | Admitting: Internal Medicine

## 2019-03-12 ENCOUNTER — Ambulatory Visit
Admission: RE | Admit: 2019-03-12 | Discharge: 2019-03-12 | Disposition: A | Discharge status: Home / Self Care | Payer: Medicare Other | Source: Ambulatory Visit | Attending: Surgery | Admitting: Surgery

## 2019-03-12 DIAGNOSIS — D0511 Intraductal carcinoma in situ of right breast: Secondary | ICD-10-CM | POA: Diagnosis present

## 2019-03-19 ENCOUNTER — Encounter: Payer: Self-pay | Admitting: Surgery

## 2019-03-19 ENCOUNTER — Other Ambulatory Visit: Payer: Self-pay

## 2019-03-19 ENCOUNTER — Ambulatory Visit: Payer: Medicare Other | Admitting: Surgery

## 2019-03-19 VITALS — BP 151/77 | HR 84 | Temp 97.5°F | Resp 12 | Ht 62.0 in | Wt 134.4 lb

## 2019-03-19 DIAGNOSIS — D0511 Intraductal carcinoma in situ of right breast: Secondary | ICD-10-CM

## 2019-03-19 NOTE — Progress Notes (Signed)
03/19/2019  History of Present Illness: Kristin Davies is a 73 y.o. female with a history of right breast DCIS, s/p lumpectomy on 03/26/15.  She presents today for follow up.  Reports doing well.  She does her self exams every month and denies any palpable masses, skin lesions or changes, nipple drainage, or other concerns.  She had her mammogram on 03/12/19.  Past Medical History: Past Medical History:  Diagnosis Date  . Angina at rest [facility])   . Arthritis   . Breast cancer ([place]) 2016   DCIS, ER negative, PR negative, high-grade. 3.5 cm. 1 mm deep margin (pectoralis fascia). MammoSite radiation.  . Cancer ([place]) 03/26/2015   DCIS, ER negative, PR negative, high-grade. 3.5 cm. 1 mm deep margin (pectoralis fascia).  . Osteoarthritis   . Osteoporosis   . Personal history of radiation therapy 2016   BREAST CA  . PONV (postoperative nausea and vomiting)   . SVT (supraventricular tachycardia) ([place]) 2012  . Trigeminal neuralgia      Past Surgical History: Past Surgical History:  Procedure Laterality Date  . BREAST BIOPSY Right 1985   Negative  . BREAST BIOPSY Right 03/02/2015   DCIS  . BREAST CYST ASPIRATION Right 1985   Negative  . [place]   . BREAST LUMPECTOMY Right 03/26/2015   Procedure: BREAST LUMPECTOMY;  Surgeon: Robert Bellow, MD;  Location: [facility] ORS;  Service: General;  Laterality: Right;  . BREAST MAMMOSITE Right 04/28/2015  . BUNIONECTOMY WITH HAMMERTOE RECONSTRUCTION Left 2012  . COLONOSCOPY  2013   Dr Vira Agar  . CRANIOPLASTY  2014  . [place]  2013  . LEFT HEART CATH AND CORONARY ANGIOGRAPHY N/A 11/16/2018   Procedure: LEFT HEART CATH AND CORONARY ANGIOGRAPHY;  Surgeon: Nelva Bush, MD;  Location: [facility];  Service: Cardiovascular;  Laterality: N/A;  . VAGINAL HYSTERECTOMY  1978    Home Medications: Prior to Admission medications   Medication Sig Start Date End Date Taking?  Authorizing Provider  acetaminophen (TYLENOL) 500 MG tablet Take 500 mg by mouth every 6 (six) hours as needed.   Yes [provider]  alendronate (FOSAMAX) 70 MG tablet Take 70 mg by mouth once a week. Friday 10/27/15  Yes [provider]  aspirin EC 81 MG EC tablet Take 1 tablet (81 mg total) by mouth daily. 11/14/18  Yes Gladstone Lighter, MD  Biotin 5 MG TBDP Take 1 tablet by mouth daily.    Yes [provider]  cholecalciferol (VITAMIN D) 25 MCG (1000 UT) tablet Take 5,000 Units by mouth daily.    Yes [provider]  Cyanocobalamin (VITAMIN B-12) 1000 MCG SUBL Take 1 tablet by mouth daily.    Yes [provider]  gabapentin (NEURONTIN) 100 MG capsule Take by mouth. 02/08/19 02/08/20 Yes [provider]  Glucosamine-Chondroit-Vit C-Mn (GLUCOSAMINE CHONDR 1500 COMPLX) CAPS Take 2 capsules by mouth daily.    Yes [provider]  isosorbide mononitrate (IMDUR) 30 MG 24 hr tablet Take 0.5 tablets (15 mg total) by mouth daily. 02/11/19  Yes Dunn, Areta Haber, PA-C  lamoTRIgine (LAMICTAL) 100 MG tablet Take 150 mg by mouth 2 (two) times daily.  11/21/16  Yes [provider]  Lutein 40 MG CAPS Take 1 capsule by mouth daily.    Yes [provider]  metoprolol succinate (TOPROL-XL) 50 MG 24 hr tablet Take 1 tablet (50 mg total) by mouth daily. 01/07/19  Yes End, Harrell Gave, MD  Multiple Vitamin (MULTIVITAMIN) capsule Take 1 capsule by mouth daily.   Yes [provider]  nitroGLYCERIN (NITROSTAT) 0.4 MG SL tablet Place 1 tablet (0.4 mg total) under the tongue every 5 (five) minutes as needed for chest pain. 11/13/18  Yes Gladstone Lighter, MD  NON FORMULARY Take 700 mg by mouth daily. Garlic and ginger   Yes [provider]  psyllium (REGULOID) 0.52 g capsule Take 2,625 mg by mouth daily.   Yes [provider]  pyridOXINE (VITAMIN B-6) 50 MG tablet Take 50 mg by mouth daily.   Yes [provider]   rosuvastatin (CRESTOR) 20 MG tablet TAKE 1 TABLET BY MOUTH  DAILY AT 6 PM 02/19/19  Yes End, Harrell Gave, MD  vitamin C (ASCORBIC ACID) 500 MG tablet Take 500 mg by mouth daily.    Yes [provider]    Allergies: No Known Allergies  Review of Systems: Review of Systems  Constitutional: Negative for chills and fever.  Respiratory: Negative for shortness of breath.   Cardiovascular: Negative for chest pain.  Gastrointestinal: Negative for nausea and vomiting.  Skin: Negative for rash.    Physical Exam BP (!) 151/77   Pulse 84   Temp (!) 97.5 F (36.4 C) (Temporal)   Resp 12   Ht 5\' 2"  (1.575 m)   Wt 134 lb 6.4 oz (61 kg)   SpO2 97%   BMI 24.58 kg/m  CONSTITUTIONAL: No acute distress HEENT:  Normocephalic, atraumatic, extraocular motion intact. RESPIRATORY:  Lungs are clear, and breath sounds are equal bilaterally. Normal respiratory effort without pathologic use of accessory muscles. CARDIOVASCULAR: Heart is regular without murmurs, gallops, or rubs. BREAST:  Right breast s/p lumpectomy with well healed upper outer quadrant scar.  No palpable masses, skin changes, or nipple changes.  No right axillary or supraclavicular lymphadenopathy.  On left breast, no palpable masses, skin changes, or nipple changes.  No left axillary or supraclavicular lymphadenopathy. NEUROLOGIC:  Motor and sensation is grossly normal.  Cranial nerves are grossly intact. PSYCH:  Alert and oriented to person, place and time. Affect is normal.  Labs/Imaging: Mammogram 03/12/19: FINDINGS: Lumpectomy changes in the outer right breast. No mass, nonsurgical architectural distortion, or suspicious microcalcification is identified to suggest malignancy.  Mammographic images were processed with CAD.  IMPRESSION: No evidence of malignancy in either breast. Lumpectomy changes on the right.  RECOMMENDATION: Diagnostic mammogram is suggested in 1 year  Assessment and Plan: This is a 73 y.o.  female s/p right breast lumpectomy for DCIS  Discussed mammogram findings and that all her mammogram have been negative thus far.  Exam today is reassuring.  Patient will follow up in 1 year with bilateral diagnostic mammogram.  The patient canceled an appointment with Dr. Baruch Gouty due to COVID but I recommended that she reschedule now that things are more stable and we have appropriate protocols in place.  Face-to-face time spent with the patient and care providers was 15 minutes, with more than 50% of the time spent counseling, educating, and coordinating care of the patient.     Melvyn Neth, [facility]

## 2019-03-19 NOTE — Patient Instructions (Addendum)
We will contact you September 2021 to schedule your Mammogram and breast exam for October 2021.   Please call our office if you have not been contacted by our office the end of September.   Please be sure to follow up with Dr.Crystal's office.   Continue self breast exams. Call office for any new breast issues or concerns.

## 2019-04-08 MED ORDER — ROSUVASTATIN CALCIUM 40 MG PO TABS: 40.0000 mg | ORAL_TABLET | Freq: Every day | ORAL | 3 refills | Status: DC

## 2019-05-07 ENCOUNTER — Other Ambulatory Visit: Payer: Self-pay

## 2019-05-07 ENCOUNTER — Encounter: Payer: Self-pay | Admitting: Nurse Practitioner

## 2019-05-07 ENCOUNTER — Ambulatory Visit: Payer: Medicare Other | Admitting: Nurse Practitioner

## 2019-05-07 VITALS — BP 130/78 | HR 92 | Temp 98.4°F | Ht 62.0 in | Wt 134.4 lb

## 2019-05-07 DIAGNOSIS — H6123 Impacted cerumen, bilateral: Secondary | ICD-10-CM

## 2019-05-07 NOTE — Progress Notes (Signed)
Careteam: Patient Care Team: Marinda Elk, MD as PCP - General (Physician Assistant) End, Harrell Gave, MD as PCP - Cardiology (Cardiology) Kirk Ruths, MD (Internal Medicine) Bary Castilla Forest Gleason, MD (General Surgery)  Advanced Directive information Does Patient Have a Medical Advance Directive?: Yes, Does patient want to make changes to medical advance directive?: No - Patient declined  No Known Allergies  Chief Complaint  Patient presents with  . Acute Visit    Patient is seen for cerumen disimpaction bilateral ears. States she can't have fluid, needs instrumentation.      HPI: Patient is a 73 y.o. female seen in today at the [facility] for removal of ear wax. States she has an over production of ear wax and gets removed via curette every 2 months. Comes in before it is impacted so it is easily removed.  Can not tolerate the water/wash in her ear due to right canal being curved water does not drain out well.  Also with hearing loss and uses hearing aid to right ear. 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    Review of Systems:  Review of Systems  Constitutional: Negative for chills and fever.  HENT: Positive for hearing loss. Negative for ear pain.        Itchy ears  Skin: Negative.     Past Medical History:  Diagnosis Date  . Angina at rest [facility])   . Arthritis   . Breast cancer ([place]) 2016   DCIS, ER negative, PR negative, high-grade. 3.5 cm. 1 mm deep margin (pectoralis fascia). MammoSite radiation.  . Cancer ([place]) 03/26/2015   DCIS, ER negative, PR negative, high-grade. 3.5 cm. 1 mm deep margin (pectoralis fascia).  . Osteoarthritis   . Osteoporosis   . Personal history of radiation therapy 2016   BREAST CA  . PONV (postoperative nausea and vomiting)   . SVT (supraventricular tachycardia) ([place]) 2012  . Trigeminal neuralgia    Past Surgical History:  Procedure Laterality Date  . BREAST BIOPSY Right 1985   Negative  . BREAST BIOPSY  Right 03/02/2015   DCIS  . BREAST CYST ASPIRATION Right 1985   Negative  . [place]   . BREAST LUMPECTOMY Right 03/26/2015   Procedure: BREAST LUMPECTOMY;  Surgeon: Robert Bellow, MD;  Location: [facility] ORS;  Service: General;  Laterality: Right;  . BREAST MAMMOSITE Right 04/28/2015  . BUNIONECTOMY WITH HAMMERTOE RECONSTRUCTION Left 2012  . COLONOSCOPY  2013   Dr Vira Agar  . CRANIOPLASTY  2014  . [place]  2013  . LEFT HEART CATH AND CORONARY ANGIOGRAPHY N/A 11/16/2018   Procedure: LEFT HEART CATH AND CORONARY ANGIOGRAPHY;  Surgeon: Nelva Bush, MD;  Location: [facility];  Service: Cardiovascular;  Laterality: N/A;  . VAGINAL HYSTERECTOMY  1978   Social History:   reports that she has never smoked. She has never used smokeless tobacco. She reports current alcohol use of about 7.0 standard drinks of alcohol per week. She reports that she does not use drugs.  Family History  Problem Relation Age of Onset  . Heart disease Father        died of heart attack and stroke at 4  . Alzheimer's disease Mother   . Breast cancer Paternal Aunt        mid 22's  . Heart disease Maternal Grandfather   . Heart disease Paternal Grandmother     Medications: Patient's Medications  New Prescriptions  No medications on file  Previous Medications   ACETAMINOPHEN (TYLENOL) 500 MG TABLET    Take 500 mg by mouth every 6 (six) hours as needed.   ALENDRONATE (FOSAMAX) 70 MG TABLET    Take 70 mg by mouth once a week. Friday   ASPIRIN EC 81 MG EC TABLET    Take 1 tablet (81 mg total) by mouth daily.   BIOTIN 5 MG TBDP    Take 1 tablet by mouth daily.    CHOLECALCIFEROL (VITAMIN D) 25 MCG (1000 UT) TABLET    Take 5,000 Units by mouth daily.    CYANOCOBALAMIN (VITAMIN B-12) 1000 MCG SUBL    Take 1 tablet by mouth daily.    GABAPENTIN (NEURONTIN) 100 MG CAPSULE    Take 100 mg by mouth daily as needed.    GLUCOSAMINE-CHONDROIT-VIT  C-MN (GLUCOSAMINE CHONDR 1500 COMPLX) CAPS    Take 2 capsules by mouth daily.    ISOSORBIDE MONONITRATE (IMDUR) 30 MG 24 HR TABLET    Take 0.5 tablets (15 mg total) by mouth daily.   LAMOTRIGINE (LAMICTAL) 100 MG TABLET    Take 125-150 mg by mouth 2 (two) times daily. 125 mg QAM, 150 QPM   LUTEIN 40 MG CAPS    Take 1 capsule by mouth daily.    METOPROLOL SUCCINATE (TOPROL-XL) 50 MG 24 HR TABLET    Take 1 tablet (50 mg total) by mouth daily.   MULTIPLE VITAMIN (MULTIVITAMIN) CAPSULE    Take 1 capsule by mouth daily.   NITROGLYCERIN (NITROSTAT) 0.4 MG SL TABLET    Place 1 tablet (0.4 mg total) under the tongue every 5 (five) minutes as needed for chest pain.   NON FORMULARY    Take 700 mg by mouth daily. Garlic and ginger   PSYLLIUM (REGULOID) 0.52 G CAPSULE    Take 2,625 mg by mouth daily.   PYRIDOXINE (VITAMIN B-6) 50 MG TABLET    Take 50 mg by mouth daily.   ROSUVASTATIN (CRESTOR) 40 MG TABLET    Take 1 tablet (40 mg total) by mouth daily.   VITAMIN C (ASCORBIC ACID) 500 MG TABLET    Take 500 mg by mouth daily.   Modified Medications   No medications on file  Discontinued Medications   No medications on file    Physical Exam:  Vitals:   05/07/19 1056  BP: 130/78  Pulse: 92  Temp: 98.4 F (36.9 C)  TempSrc: Oral  SpO2: 97%  Weight: 134 lb 6.4 oz (61 kg)  Height: 5\' 2"  (1.575 m)   Body mass index is 24.58 kg/m. Wt Readings from Last 3 Encounters:  05/07/19 134 lb 6.4 oz (61 kg)  03/19/19 134 lb 6.4 oz (61 kg)  01/31/19 131 lb (59.4 kg)    Physical Exam Constitutional:      Appearance: Normal appearance.  HENT:     Right Ear: Tympanic membrane normal.     Left Ear: Tympanic membrane normal.     Ears:     Comments: Increased wax build up bilaterally Skin:    General: Skin is warm and dry.  Neurological:     Mental Status: She is alert and oriented to person, place, and time.  Psychiatric:        Mood and Affect: Mood normal.        Behavior: Behavior normal.      Labs reviewed: Basic Metabolic Panel: Recent Labs    11/13/18 0230  NA 139  K 4.0  CL 102  CO2 27  GLUCOSE 120*  BUN 23  CREATININE 0.73  CALCIUM 8.9   Liver Function Tests: Recent Labs    01/31/19 0849  AST 31  ALT 28  ALKPHOS 49  BILITOT 0.6  PROT 6.6  ALBUMIN 4.3   No results for input(s): LIPASE, AMYLASE in the last 8760 hours. No results for input(s): AMMONIA in the last 8760 hours. CBC: Recent Labs    11/13/18 0230  WBC 7.1  HGB 13.3  HCT 41.5  MCV 96.1  PLT 243   Lipid Panel: Recent Labs    11/13/18 0815 01/31/19 0849  CHOL 293* 164  HDL 89 82  LDLCALC 186* 66  TRIG 91 78  CHOLHDL 3.3 2.0   TSH: No results for input(s): TSH in the last 8760 hours. A1C: No results found for: HGBA1C   Assessment/Plan 1. Bilateral impacted cerumen Hx of increase production of ear wax bilaterally, came into for removal before impaction, pt request this to be done via curette due to not being able to tolerate water in ear. Wax was removed bilaterally with curette and she tolerated well.   Carlos American. [place], [facility] [phone]

## 2019-05-22 NOTE — Telephone Encounter (Signed)
Call to patient to make her aware of POC from Ignacia Bayley, NP.   Pt will hold medication starting tomorrow and will call us back on Monday to make [place] aware of how headaches are doing. If she has any increase in BP or chest discomfort she will resume medication.   I made her aware that we are closed fri but if she has any issues to call the phones for urgent care hotline.   Pt will continue taking vital signs and will report them as well as current sx on Monday.

## 2019-05-23 DIAGNOSIS — I10 Essential (primary) hypertension: Secondary | ICD-10-CM

## 2019-05-28 MED ORDER — LOSARTAN POTASSIUM 25 MG PO TABS: 25.0000 mg | ORAL_TABLET | Freq: Every day | ORAL | 5 refills | Status: DC

## 2019-06-02 ENCOUNTER — Telehealth: Payer: Self-pay | Admitting: Physician Assistant

## 2019-06-02 NOTE — Telephone Encounter (Signed)
Pt recently instructed to hold imdur for headache. Stopping the imdur reduced her headache, but she is now hypertensive. Started on losartan 25 mg on Jan 6. Since then, her pressure has been increasing: 143/82 154/89 148/91  She is also noticing palpitations and shortness of breath. No recent illness or sick contacts. She has still been able to exercise - stationary bike for 1 hr yesterday. SOB started today. Bathroom at [facility] today and started feeling poorly in her chest. Her main concern is her blood pressure and palpitations. Doesn't sound like classic angina, recent heart cath reassuring. I advised to double her losartan to 50 mg and increase her toprol to 75 mg with close follow up tomorrow. I also advised that we should not ignore a possible underlying viral illness.

## 2019-06-03 ENCOUNTER — Other Ambulatory Visit: Payer: Self-pay

## 2019-06-03 ENCOUNTER — Encounter: Payer: Self-pay | Admitting: Internal Medicine

## 2019-06-03 ENCOUNTER — Ambulatory Visit (INDEPENDENT_AMBULATORY_CARE_PROVIDER_SITE_OTHER): Payer: Medicare Other | Admitting: Internal Medicine

## 2019-06-03 VITALS — BP 136/80 | HR 84 | Ht 61.0 in | Wt 130.5 lb

## 2019-06-03 DIAGNOSIS — I10 Essential (primary) hypertension: Secondary | ICD-10-CM | POA: Diagnosis not present

## 2019-06-03 DIAGNOSIS — Z79899 Other long term (current) drug therapy: Secondary | ICD-10-CM

## 2019-06-03 NOTE — Patient Instructions (Signed)
Medication Instructions:  Your physician recommends that you continue on your current medications as directed. Please refer to the Current Medication list given to you today.  *If you need a refill on your cardiac medications before your next appointment, please call your pharmacy*  Lab Work: Your physician recommends that you return for lab work in: [place] at [employer]. Anytime the week of January 18th. Please go to the [facility]. You will check in at the front desk to the right as you walk into the atrium. Valet Parking is offered if needed.    If you have labs (blood work) drawn today and your tests are completely normal, you will receive your results only by: Marland Kitchen MyChart Message (if you have MyChart) OR . A paper copy in the mail If you have any lab test that is abnormal or we need to change your treatment, we will call you to review the results.  Testing/Procedures: none  Follow-Up: At [facility], you and your health needs are our priority.  As part of our continuing mission to provide you with exceptional heart care, we have created designated Provider Care Teams.  These Care Teams include your primary Cardiologist (physician) and Advanced Practice Providers (APPs -  Physician Assistants and Nurse Practitioners) who all work together to provide you with the care you need, when you need it.  Your next appointment:   1 month(s)  The format for your next appointment:   In Person  Provider:    You may see Nelva Bush, MD or one of the following Advanced Practice Providers on your designated Care Team:    Murray Hodgkins, NP  Christell Faith, PA-C  Marrianne Mood, PA-C

## 2019-06-03 NOTE — Progress Notes (Signed)
Follow-up Outpatient Visit Date: 06/03/2019  Primary Care Provider: Marinda Elk, [place] [address]  Chief Complaint: Elevated blood pressure  HPI:  Kristin Davies is a 74 y.o. female with history of mild to moderate nonobstructive CAD by LHC on 11/16/2018, paroxysmal SVT, hyperlipidemia, meningioma, DCIS of the breast, and trigeminal neuralgia , who presents for urgent evaluation of elevated blood pressure and palpitations.  [name] reports that she stopped isosorbide mononitrate just over a week ago due to persistent headaches.  Around that time, she noticed that her blood pressure began to rise despite trying to follow a low-sodium diet and exercise regularly.  In the setting of elevated blood pressure readings, she felt anxious with transient shortness of breath.  She contacted the on-call provider and was instructed to escalate metoprolol and losartan.  She is now taking metoprolol succinate 75 mg daily and losartan 50 mg daily.  She is tolerating these increased doses, which she began yesterday, well.  She denies chest pain, palpitations, lightheadedness, and edema.  Headaches have also resolved since stopping isosorbide mononitrate.  --------------------------------------------------------------------------------------------------  Past Medical History:  Diagnosis Date  . Angina at rest [facility])   . Arthritis   . Breast cancer ([place]) 2016   DCIS, ER negative, PR negative, high-grade. 3.5 cm. 1 mm deep margin (pectoralis fascia). MammoSite radiation.  . Cancer ([place]) 03/26/2015   DCIS, ER negative, PR negative, high-grade. 3.5 cm. 1 mm deep margin (pectoralis fascia).  . Osteoarthritis   . Osteoporosis   . Personal history of radiation therapy 2016   BREAST CA  . PONV (postoperative nausea and vomiting)   . SVT (supraventricular tachycardia) ([place]) 2012  . Trigeminal neuralgia    Past Surgical History:  Procedure Laterality  Date  . BREAST BIOPSY Right 1985   Negative  . BREAST BIOPSY Right 03/02/2015   DCIS  . BREAST CYST ASPIRATION Right 1985   Negative  . [place]   . BREAST LUMPECTOMY Right 03/26/2015   Procedure: BREAST LUMPECTOMY;  Surgeon: Robert Bellow, MD;  Location: [facility] ORS;  Service: General;  Laterality: Right;  . BREAST MAMMOSITE Right 04/28/2015  . BUNIONECTOMY WITH HAMMERTOE RECONSTRUCTION Left 2012  . COLONOSCOPY  2013   Dr Vira Agar  . CRANIOPLASTY  2014  . [place]  2013  . LEFT HEART CATH AND CORONARY ANGIOGRAPHY N/A 11/16/2018   Procedure: LEFT HEART CATH AND CORONARY ANGIOGRAPHY;  Surgeon: Nelva Bush, MD;  Location: [place] [facility];  Service: Cardiovascular;  Laterality: N/A;  . VAGINAL HYSTERECTOMY  1978    Current Meds  Medication Sig  . acetaminophen (TYLENOL) 500 MG tablet Take 500 mg by mouth every 6 (six) hours as needed.  Marland Kitchen alendronate (FOSAMAX) 70 MG tablet Take 70 mg by mouth once a week. Friday  . aspirin EC 81 MG EC tablet Take 1 tablet (81 mg total) by mouth daily.  . Biotin 5 MG TBDP Take 1 tablet by mouth daily.   . cholecalciferol (VITAMIN D) 25 MCG (1000 UT) tablet Take 5,000 Units by mouth daily.   . Cyanocobalamin (VITAMIN B-12) 1000 MCG SUBL Take 1 tablet by mouth daily.   Marland Kitchen gabapentin (NEURONTIN) 100 MG capsule Take 100 mg by mouth daily as needed.   . Glucosamine-Chondroit-Vit C-Mn (GLUCOSAMINE CHONDR 1500 COMPLX) CAPS Take 2 capsules by mouth daily.   Marland Kitchen lamoTRIgine (LAMICTAL) 100 MG tablet Take 125-150 mg by mouth 2 (two)  times daily. 125 mg QAM, 150 QPM  . losartan (COZAAR) 25 MG tablet Take 50 mg by mouth daily.  . Lutein 40 MG CAPS Take 1 capsule by mouth daily.   . metoprolol succinate (TOPROL-XL) 25 MG 24 hr tablet Take 75 mg by mouth daily.  . Multiple Vitamin (MULTIVITAMIN) capsule Take 1 capsule by mouth daily.  . nitroGLYCERIN (NITROSTAT) 0.4 MG SL tablet Place 1 tablet (0.4 mg  total) under the tongue every 5 (five) minutes as needed for chest pain.  . NON FORMULARY Take 700 mg by mouth daily. Garlic and ginger  . psyllium (REGULOID) 0.52 g capsule Take 2,625 mg by mouth daily.  Marland Kitchen pyridOXINE (VITAMIN B-6) 50 MG tablet Take 50 mg by mouth daily.  . rosuvastatin (CRESTOR) 40 MG tablet Take 1 tablet (40 mg total) by mouth daily.  . vitamin C (ASCORBIC ACID) 500 MG tablet Take 500 mg by mouth daily.   . [DISCONTINUED] losartan (COZAAR) 25 MG tablet Take 1 tablet (25 mg total) by mouth daily.  . [DISCONTINUED] metoprolol succinate (TOPROL-XL) 50 MG 24 hr tablet Take 1 tablet (50 mg total) by mouth daily.    Allergies: Patient has no known allergies.  Social History   Tobacco Use  . Smoking status: Never Smoker  . Smokeless tobacco: Never Used  Substance Use Topics  . Alcohol use: Yes    Alcohol/week: 7.0 standard drinks    Types: 7 Glasses of wine per week  . Drug use: No    Family History  Problem Relation Age of Onset  . Heart disease Father        died of heart attack and stroke at 54  . Alzheimer's disease Mother   . Breast cancer Paternal Aunt        mid 22's  . Heart disease Maternal Grandfather   . Heart disease Paternal Grandmother     Review of Systems: A 12-system review of systems was performed and was negative except as noted in the HPI.  --------------------------------------------------------------------------------------------------  Physical Exam: BP 136/80 (BP Location: Left Arm, Patient Position: Sitting, Cuff Size: Normal)   Pulse 84   Ht 5\' 1"  (1.549 m)   Wt 130 lb 8 oz (59.2 kg)   SpO2 97%   BMI 24.66 kg/m   General: NAD. HEENT: No conjunctival pallor or scleral icterus. Facemask in place. Neck: Supple without lymphadenopathy, thyromegaly, JVD, or HJR. Lungs: Normal work of breathing. Clear to auscultation bilaterally without wheezes or crackles. Heart: Regular rate and rhythm without murmurs, rubs, or gallops.  Non-displaced PMI. Abd: Bowel sounds present. Soft, NT/ND without hepatosplenomegaly Ext: No lower extremity edema. Radial, PT, and DP pulses are 2+ bilaterally. Skin: Warm and dry without rash.  EKG: Normal sinus rhythm with left axis deviation, new from prior tracings.  Otherwise, no significant change.  Lab Results  Component Value Date   WBC 7.1 11/13/2018   HGB 13.3 11/13/2018   HCT 41.5 11/13/2018   MCV 96.1 11/13/2018   PLT 243 11/13/2018    Lab Results  Component Value Date   NA 139 11/13/2018   K 4.0 11/13/2018   CL 102 11/13/2018   CO2 27 11/13/2018   BUN 23 11/13/2018   CREATININE 0.73 11/13/2018   GLUCOSE 120 (H) 11/13/2018   ALT 28 01/31/2019    Lab Results  Component Value Date   CHOL 164 01/31/2019   HDL 82 01/31/2019   LDLCALC 66 01/31/2019   TRIG 78 01/31/2019   CHOLHDL 2.0 01/31/2019    --------------------------------------------------------------------------------------------------  ASSESSMENT AND PLAN: Hypertension: Blood pressure borderline elevated today but better than some recent home readings.  I am hopeful that blood pressure will continue to improve with recently increased doses of metoprolol and losartan.  No medication changes today.  We will plan for a BMP in 1 to 2 weeks to ensure stable renal function and potassium.  Nonobstructive coronary artery disease: Ms. Zocchi has not had any chest pain despite recently discontinuing isosorbide mononitrate.  We will continue metoprolol, aspirin, and rosuvastatin for progression of disease.  PSVT: No symptoms reported.  Continue metoprolol succinate at recently increased dose.  Hyperlipidemia: LDL at goal.  Continue rosuvastatin 40 mg daily.  Follow-up: Return to clinic in 1 month.  Nelva Bush, MD 06/04/2019 9:11 PM

## 2019-06-04 ENCOUNTER — Encounter: Payer: Self-pay | Admitting: Internal Medicine

## 2019-06-05 ENCOUNTER — Ambulatory Visit
Admission: RE | Admit: 2019-06-05 | Discharge: 2019-06-05 | Disposition: A | Discharge status: Home / Self Care | Payer: Medicare Other | Source: Ambulatory Visit | Attending: Radiation Oncology | Admitting: Radiation Oncology

## 2019-06-05 ENCOUNTER — Other Ambulatory Visit: Payer: Self-pay

## 2019-06-05 ENCOUNTER — Encounter: Payer: Self-pay | Admitting: Radiation Oncology

## 2019-06-05 VITALS — BP 148/86 | HR 87 | Temp 97.8°F | Resp 18 | Wt 131.8 lb

## 2019-06-05 DIAGNOSIS — Z171 Estrogen receptor negative status [ER-]: Secondary | ICD-10-CM

## 2019-06-05 DIAGNOSIS — C50411 Malignant neoplasm of upper-outer quadrant of right female breast: Secondary | ICD-10-CM

## 2019-06-05 DIAGNOSIS — Z86000 Personal history of in-situ neoplasm of breast: Secondary | ICD-10-CM | POA: Insufficient documentation

## 2019-06-05 DIAGNOSIS — Z923 Personal history of irradiation: Secondary | ICD-10-CM | POA: Diagnosis not present

## 2019-06-05 NOTE — Progress Notes (Signed)
Radiation Oncology Follow up Note  Name: Kristin Davies   Date:   06/05/2019 MRN:  [id number] DOB: 12/16/1945    This 74 y.o. 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  She is seen today in routine follow-up continues to do well.  She specifically denies breast tenderness cough or bone pain.  Patient based on ER/PR negative.  Tumor is not on antiestrogen therapy..  Her last mammogram which I have reviewed was BI-RADS 2 benign.  COMPLICATIONS OF TREATMENT: none  FOLLOW UP COMPLIANCE: keeps appointments   PHYSICAL EXAM:  BP (!) 148/86   Pulse 87   Temp 97.8 F (36.6 C)   Resp 18   Wt 131 lb 12.8 oz (59.8 kg)   BMI 24.90 kg/m  Lungs are clear to A&P cardiac examination essentially unremarkable with regular rate and rhythm. No dominant mass or nodularity is noted in either breast in 2 positions examined. Incision is well-healed. No axillary or supraclavicular adenopathy is appreciated. Cosmetic result is excellent.  Well-developed well-nourished patient in NAD. HEENT reveals PERLA, EOMI, discs not visualized.  Oral cavity is clear. No oral mucosal lesions are identified. Neck is clear without evidence of cervical or supraclavicular adenopathy. Lungs are clear to A&P. Cardiac examination is essentially unremarkable with regular rate and rhythm without murmur rub or thrill. Abdomen is benign with no organomegaly or masses noted. Motor sensory and DTR levels are equal and symmetric in the upper and lower extremities. Cranial nerves II through XII are grossly intact. Proprioception is intact. No peripheral adenopathy or edema is identified. No  motor or sensory levels are noted. Crude visual fields are within normal range.  RADIOLOGY RESULTS: Mammograms reviewed compatible with above-stated findings  PLAN: Present time patient is doing well with no evidence of disease.  At this time she is close to 5 years out I am going to discontinue follow-up care.  She continues follow-up care with medical oncology.  Patient knows to call with any concerns at any time.  I would like to take this opportunity to thank you for allowing me to participate in the care of your patient.Noreene Filbert, MD

## 2019-06-12 ENCOUNTER — Other Ambulatory Visit
Admission: RE | Admit: 2019-06-12 | Discharge: 2019-06-12 | Disposition: A | Discharge status: Home / Self Care | Payer: Medicare Other | Source: Ambulatory Visit | Attending: Internal Medicine | Admitting: Internal Medicine

## 2019-06-12 DIAGNOSIS — I10 Essential (primary) hypertension: Secondary | ICD-10-CM | POA: Insufficient documentation

## 2019-06-12 DIAGNOSIS — Z79899 Other long term (current) drug therapy: Secondary | ICD-10-CM | POA: Insufficient documentation

## 2019-06-12 LAB — BASIC METABOLIC PANEL
Anion gap: 8 (ref 5–15)
BUN: 28 mg/dL — ABNORMAL HIGH (ref 8–23)
CO2: 28 mmol/L (ref 22–32)
Calcium: 9.5 mg/dL (ref 8.9–10.3)
Chloride: 102 mmol/L (ref 98–111)
Creatinine, Ser: 0.75 mg/dL (ref 0.44–1.00)
GFR calc Af Amer: >60 mL/min (ref >60)
GFR calc non Af Amer: >60 mL/min (ref >60)
Glucose, Bld: 87 mg/dL (ref 70–99)
Potassium: 5.2 mmol/L — ABNORMAL HIGH (ref 3.5–5.1)
Sodium: 138 mmol/L (ref 135–145)

## 2019-06-14 ENCOUNTER — Telehealth: Payer: Self-pay

## 2019-06-14 NOTE — Telephone Encounter (Signed)
Called and spoke with Kristin Davies regarding her labs and recommendations per Dr. Saunders Revel.  Pt reports that she had noted them in her my chart last night.  Pt verbalized understanding regarding decreasing her potassium and the need for repeat labs in 2 weeks.  Pt also states that she is purchasing her an new blood pressure monitor for home.

## 2019-06-14 NOTE — Telephone Encounter (Signed)
-----   Message from Nelva Bush, MD sent at 06/14/2019  7:11 AM EST ----- Please let Kristin Davies  know that her potassium is mildly elevated since losartan was increased.  I think it would be reasonable to continue her current medications and to recheck a basic metabolic panel in about 2 weeks.  In the meantime, I suggest that she try to limit her dietary potassium intake.

## 2019-06-26 ENCOUNTER — Other Ambulatory Visit
Admission: RE | Admit: 2019-06-26 | Discharge: 2019-06-26 | Disposition: A | Discharge status: Home / Self Care | Payer: Medicare Other | Source: Ambulatory Visit | Attending: Internal Medicine | Admitting: Internal Medicine

## 2019-06-26 DIAGNOSIS — Z79899 Other long term (current) drug therapy: Secondary | ICD-10-CM | POA: Diagnosis not present

## 2019-06-26 DIAGNOSIS — I10 Essential (primary) hypertension: Secondary | ICD-10-CM | POA: Diagnosis present

## 2019-06-26 LAB — BASIC METABOLIC PANEL WITH GFR
Anion gap: 10 (ref 5–15)
BUN: 19 mg/dL (ref 8–23)
CO2: 28 mmol/L (ref 22–32)
Calcium: 9.7 mg/dL (ref 8.9–10.3)
Chloride: 100 mmol/L (ref 98–111)
Creatinine, Ser: 0.76 mg/dL (ref 0.44–1.00)
GFR calc Af Amer: >60 mL/min
GFR calc non Af Amer: >60 mL/min
Glucose, Bld: 138 mg/dL — ABNORMAL HIGH (ref 70–99)
Potassium: 4.5 mmol/L (ref 3.5–5.1)
Sodium: 138 mmol/L (ref 135–145)

## 2019-07-01 ENCOUNTER — Telehealth: Payer: Self-pay

## 2019-07-01 MED ORDER — METOPROLOL SUCCINATE ER 50 MG PO TB24: 50.0000 mg | ORAL_TABLET | Freq: Every day | ORAL | 3 refills | Status: DC

## 2019-07-01 MED ORDER — METOPROLOL SUCCINATE ER 25 MG PO TB24: 25.0000 mg | ORAL_TABLET | Freq: Every day | ORAL | 3 refills | Status: DC

## 2019-07-01 MED ORDER — LOSARTAN POTASSIUM 50 MG PO TABS: 50.0000 mg | ORAL_TABLET | Freq: Every day | ORAL | 3 refills | Status: DC

## 2019-07-01 NOTE — Telephone Encounter (Signed)
A new Rx for Metoprolol succ 50 mg & 25 mg to equal the Metoprolol Succ 75 mg tablet daily.

## 2019-07-05 NOTE — Progress Notes (Signed)
Cardiology Office Note    Date:  07/15/2019   ID:  Kristin Davies, Kristin Davies 1945/11/14, MRN [id number]  PCP:  Marinda Elk, MD  Cardiologist:  Nelva Bush, MD  Electrophysiologist:  None   Chief Complaint: Follow up  History of Present Illness:   Kristin Davies is a 74 y.o. female with history of mild to moderate nonobstructive CAD by LHC on 11/16/2018, paroxysmal SVT, hypertension, hyperlipidemia, meningioma, DCIS of the breast, and trigeminal neuralgia who presents for follow up of HTN.   She previously underwent exercise tolerance test in 2007 while living in [place] for an episode of chest pain that was reportedly normal. She has been managed on metoprolol for several years through her PCP for paroxysmal SVT. She was admitted to the hospital in 10/2018 with chest pain that woke her up from sleep which she described as indigestion and radiated upwards towards the left shoulder and neck. Blood pressure was noted to be elevated ranging from the Q000111Q to A999333 systolic. EKG showed nonspecific ST-T changes with possible septal infarct versus lead placement. Troponin negative x4.She underwent Lexiscan Myoview that showed a moderate in size, mild in severity, reversible defect involving the mid and apical anterior/anterolateral segments concerning for ischemia though artifact could not be excluded (breast attenuation and misregistration). LV systolic function was normal at greater than 65%. Borderline transient ischemic dilation was noted. Attenuation corrected CT images demonstrated coronary artery and aortic atherosclerotic calcification. Overall, this was an abnormal, potentially high risk pharmacologic myocardial perfusion stress test. During her admission she continued to feel well. Cardiac catheterization was recommended, though she preferred to do this as an outpatient. In this setting, she underwent diagnostic cath on 11/16/2018 which showed mild to moderate, nonobstructive CAD with  proximal LAD calcified 20% stenosis, mid LAD 30% stenosis, D1 40% stenosis, proximal LCx 50% stenosis, mid RCA focal and eccentric 50% stenosis. The LV systolic function and LVEDP were normal.  She was medically managed.    In late 04/2019, she noted a several week history of headaches, occurring about an hour after taking her medication. Her BP had ranged from the high 100s to 130s/mid 70s. In this setting, she was advised to hold Imdur. With this, her headache resolved. She noted some higher BP readings in the Q000111Q to Q000111Q systolic and was started on losartan 25 mg. She called the answering service in 05/2019 noting BP in the 140s to 150s as well as some SOB and palpitations. With this, her Toprol and losartan were titrated to 75 mg and 50 mg, respectively.   She was seen by Dr. Saunders Revel on 06/03/2019 for follow up of the above and was tolerating the above titrated doses. She continued to note resolution of headache off Imdur. Her BP was 136/80 and a HR of 84 bpm. Follow up labs showed stable renal function and potassium as below.   She comes in doing well today.  No chest pain, palpitations, dizziness, or shortness of breath.  No lower extremity swelling.  Blood pressure at home has averaged between the Q000111Q and 0000000 systolic with a rare reading in the [id number] or Q000111Q systolic.  She is tolerating titrated doses of Toprol-XL and losartan.  She has recently been started on lorazepam with improvement in underlying mild anxiety.  She strictly follows a low-sodium/low-fat diet.  At baseline, she is very active and exercises regularly, however in the setting of the very poor weather we have had lately she has not been exercising as much.  Labs independently reviewed: 06/2019 - potassium 4.5, BUN 19, SCr 0.76 03/2019 - AST/ALT normal, albumin 4.4, TC 196, TG 94, HDL 89, LDL 88 10/2018 - HGB 13.3, PLT 243 02/2018 - TSH normal  Past Medical History:  Diagnosis Date  . Angina at rest [facility])   . Arthritis   .  Breast cancer ([place]) 2016   DCIS, ER negative, PR negative, high-grade. 3.5 cm. 1 mm deep margin (pectoralis fascia). MammoSite radiation.  . Cancer ([place]) 03/26/2015   DCIS, ER negative, PR negative, high-grade. 3.5 cm. 1 mm deep margin (pectoralis fascia).  . Osteoarthritis   . Osteoporosis   . Personal history of radiation therapy 2016   BREAST CA  . PONV (postoperative nausea and vomiting)   . SVT (supraventricular tachycardia) ([place]) 2012  . Trigeminal neuralgia     Past Surgical History:  Procedure Laterality Date  . BREAST BIOPSY Right 1985   Negative  . BREAST BIOPSY Right 03/02/2015   DCIS  . BREAST CYST ASPIRATION Right 1985   Negative  . [place]   . BREAST LUMPECTOMY Right 03/26/2015   Procedure: BREAST LUMPECTOMY;  Surgeon: Robert Bellow, MD;  Location: [facility] ORS;  Service: General;  Laterality: Right;  . BREAST MAMMOSITE Right 04/28/2015  . BUNIONECTOMY WITH HAMMERTOE RECONSTRUCTION Left 2012  . COLONOSCOPY  2013   Dr Vira Agar  . CRANIOPLASTY  2014  . [place]  2013  . LEFT HEART CATH AND CORONARY ANGIOGRAPHY N/A 11/16/2018   Procedure: LEFT HEART CATH AND CORONARY ANGIOGRAPHY;  Surgeon: Nelva Bush, MD;  Location: [place] [facility];  Service: Cardiovascular;  Laterality: N/A;  . VAGINAL HYSTERECTOMY  1978    Current Medications: Current Meds  Medication Sig  . acetaminophen (TYLENOL) 500 MG tablet Take 500 mg by mouth every 6 (six) hours as needed.  Marland Kitchen alendronate (FOSAMAX) 70 MG tablet Take 70 mg by mouth once a week. Friday  . aspirin EC 81 MG EC tablet Take 1 tablet (81 mg total) by mouth daily.  . Biotin 5 MG TBDP Take 1 tablet by mouth daily.   . cholecalciferol (VITAMIN D) 25 MCG (1000 UT) tablet Take 5,000 Units by mouth daily.   . Cyanocobalamin (VITAMIN B-12) 1000 MCG SUBL Take 1 tablet by mouth daily.   Marland Kitchen gabapentin (NEURONTIN) 100 MG capsule Take 100 mg by mouth daily as needed.   .  Glucosamine-Chondroit-Vit C-Mn (GLUCOSAMINE CHONDR 1500 COMPLX) CAPS Take 2 capsules by mouth daily.   Marland Kitchen lamoTRIgine (LAMICTAL) 100 MG tablet Take 125-150 mg by mouth 2 (two) times daily. 125 mg QAM, 150 QPM  . LORazepam (ATIVAN) 0.5 MG tablet Take 0.5 mg by mouth daily.  Marland Kitchen losartan (COZAAR) 50 MG tablet Take 1 tablet (50 mg total) by mouth daily.  . Lutein 40 MG CAPS Take 1 capsule by mouth daily.   . metoprolol succinate (TOPROL XL) 25 MG 24 hr tablet Take 1 tablet (25 mg total) by mouth daily. Take as directed  . metoprolol succinate (TOPROL-XL) 50 MG 24 hr tablet Take 1 tablet (50 mg total) by mouth daily. Take with or immediately following a meal as directed  . Multiple Vitamin (MULTIVITAMIN) capsule Take 1 capsule by mouth daily.  . nitroGLYCERIN (NITROSTAT) 0.4 MG SL tablet Place 1 tablet (0.4 mg total) under the tongue every 5 (five) minutes as needed for chest pain.  . NON FORMULARY Take 700 mg by mouth daily. Garlic and ginger  . psyllium (REGULOID)  0.52 g capsule Take 2,625 mg by mouth daily.  Marland Kitchen pyridOXINE (VITAMIN B-6) 50 MG tablet Take 50 mg by mouth daily.  . rosuvastatin (CRESTOR) 40 MG tablet Take 1 tablet (40 mg total) by mouth daily.  . vitamin C (ASCORBIC ACID) 500 MG tablet Take 500 mg by mouth daily.     Allergies:   Patient has no known allergies.   Social History   Socioeconomic History  . Marital status: Married    Spouse name: Joe  . Number of children: 3  . Years of education: Not on file  . Highest education level: Not on file  Occupational History  . Not on file  Tobacco Use  . Smoking status: Never Smoker  . Smokeless tobacco: Never Used  Substance and Sexual Activity  . Alcohol use: Yes    Alcohol/week: 7.0 standard drinks    Types: 7 Glasses of wine per week    Comment: 5 oz every night  . Drug use: No  . Sexual activity: Not on file  Other Topics Concern  . Not on file  Social History Narrative  . Not on file   Social Determinants of Health     Financial Resource Strain: Low Risk   . Difficulty of Paying Living Expenses: Not very hard  Food Insecurity: No Food Insecurity  . Worried About Charity fundraiser in the Last Year: Never true  . Ran Out of Food in the Last Year: Never true  Transportation Needs: No Transportation Needs  . Lack of Transportation (Medical): No  . Lack of Transportation (Non-Medical): No  Physical Activity: Sufficiently Active  . Days of Exercise per Week: 4 days  . Minutes of Exercise per Session: 40 min  Stress: No Stress Concern Present  . Feeling of Stress : Only a little  Social Connections: Unknown  . Frequency of Communication with Friends and Family: More than three times a week  . Frequency of Social Gatherings with Friends and Family: Not on file  . Attends Religious Services: Not on file  . Active Member of Clubs or Organizations: Not on file  . Attends Archivist Meetings: Not on file  . Marital Status: Not on file     Family History:  The patient's family history includes Alzheimer's disease in her mother; Breast cancer in her paternal aunt; Heart disease in her father, maternal grandfather, and paternal grandmother.  ROS:   Review of Systems  Constitutional: Negative for chills, diaphoresis, fever, malaise/fatigue and weight loss.  HENT: Negative for congestion.   Eyes: Negative for discharge and redness.  Respiratory: Negative for cough, sputum production, shortness of breath and wheezing.   Cardiovascular: Negative for chest pain, palpitations, orthopnea, claudication, leg swelling and PND.  Gastrointestinal: Negative for abdominal pain, heartburn, nausea and vomiting.  Musculoskeletal: Negative for falls and myalgias.  Skin: Negative for rash.  Neurological: Negative for dizziness, tingling, tremors, sensory change, speech change, focal weakness, loss of consciousness and weakness.  Psychiatric/Behavioral: Negative for substance abuse. The patient is not  nervous/anxious.   All other systems reviewed and are negative.    EKGs/Labs/Other Studies Reviewed:    Studies reviewed were summarized above. The additional studies were reviewed today:  LHC 10/2018: Conclusions: 1. Mild to moderate, non-obstructive coronary artery disease. 2. Normal left ventricular systolic function and filling pressure.  Recommendations: 1. Continue current medical therapy and risk factor modification. 2. Follow-up in the office in ~2 weeks.   EKG:  EKG is ordered today.  The  EKG ordered today demonstrates NSR, 81 bpm, nonspecific inferior ST-T changes  Recent Labs: 11/13/2018: Hemoglobin 13.3; Platelets 243 01/31/2019: ALT 28 06/26/2019: BUN 19; Creatinine, Ser 0.76; Potassium 4.5; Sodium 138  Recent Lipid Panel    Component Value Date/Time   CHOL 164 01/31/2019 0849   TRIG 78 01/31/2019 0849   HDL 82 01/31/2019 0849   CHOLHDL 2.0 01/31/2019 0849   VLDL 16 01/31/2019 0849   LDLCALC 66 01/31/2019 0849    PHYSICAL EXAM:    VS:  BP (!) 154/90 (BP Location: Left Arm, Patient Position: Sitting, Cuff Size: Normal)   Pulse 81   Temp 98.1 F (36.7 C)   Ht 5\' 2"  (1.575 m)   Wt 132 lb 8 oz (60.1 kg)   SpO2 98%   BMI 24.23 kg/m   BMI: Body mass index is 24.23 kg/m.  Physical Exam  Constitutional: She is oriented to person, place, and time. She appears well-developed and well-nourished.  HENT:  Head: Normocephalic and atraumatic.  Eyes: Right eye exhibits no discharge. Left eye exhibits no discharge.  Neck: No JVD present.  Cardiovascular: Normal rate, regular rhythm, S1 normal, S2 normal and normal heart sounds. Exam reveals no distant heart sounds, no friction rub, no midsystolic click and no opening snap.  No murmur heard. Pulses:      Posterior tibial pulses are 2+ on the right side and 2+ on the left side.  Pulmonary/Chest: Effort normal and breath sounds normal. No respiratory distress. She has no decreased breath sounds. She has no wheezes. She  has no rales. She exhibits no tenderness.  Abdominal: Soft. She exhibits no distension. There is no abdominal tenderness.  Musculoskeletal:        General: No edema.     Cervical back: Normal range of motion.  Neurological: She is alert and oriented to person, place, and time.  Skin: Skin is warm and dry. No cyanosis. Nails show no clubbing.  Psychiatric: She has a normal mood and affect. Her speech is normal and behavior is normal. Judgment and thought content normal.    Wt Readings from Last 3 Encounters:  07/15/19 132 lb 8 oz (60.1 kg)  06/05/19 131 lb 12.8 oz (59.8 kg)  06/03/19 130 lb 8 oz (59.2 kg)     ASSESSMENT & PLAN:   1. HTN: Blood pressure is suboptimally controlled today at 154/90.  She has just taken her medications approximately 10 minutes prior to her appointment this morning.  BP continues to run in the Q000111Q to 0000000 systolic on average.  Titrate Toprol-XL to 100 mg daily.  Continue losartan 50 mg daily.  Check renal artery ultrasound to evaluate for renal artery stenosis.  Continue low-sodium diet.  2. Nonobstructive CAD: No symptoms concerning for angina.  Nonobstructive CAD from 10/2018 as outlined above.  Continue risk factor modification and current medical therapy including aspirin, metoprolol, and Crestor.  3. Paroxysmal SVT: Asymptomatic.  Continue Toprol-XL at current dose as outlined above.  Most recent potassium at goal.  Thyroid function normal.  4. HLD: LDL of 88 from 03/2019.  Remains on Crestor 40 mg daily with normal liver function.  Consider addition of Zetia if LDL remains above 70 at recheck later this spring.   Disposition: F/u with Dr. Saunders Revel or an APP in 1 month.   Medication Adjustments/Labs and Tests Ordered: Current medicines are reviewed at length with the patient today.  Concerns regarding medicines are outlined above. Medication changes, Labs and Tests ordered today are summarized above and  listed in the Patient Instructions accessible in  Encounters.   Signed, Christell Faith, PA-C 07/15/2019 8:03 AM     [employer] [address] [place], [place] [address] [phone]

## 2019-07-15 ENCOUNTER — Ambulatory Visit (INDEPENDENT_AMBULATORY_CARE_PROVIDER_SITE_OTHER): Payer: Medicare Other | Admitting: Physician Assistant

## 2019-07-15 ENCOUNTER — Other Ambulatory Visit: Payer: Self-pay

## 2019-07-15 ENCOUNTER — Encounter: Payer: Self-pay | Admitting: Physician Assistant

## 2019-07-15 VITALS — BP 154/90 | HR 81 | Temp 98.1°F | Ht 62.0 in | Wt 132.5 lb

## 2019-07-15 DIAGNOSIS — E785 Hyperlipidemia, unspecified: Secondary | ICD-10-CM

## 2019-07-15 DIAGNOSIS — I2510 Atherosclerotic heart disease of native coronary artery without angina pectoris: Secondary | ICD-10-CM

## 2019-07-15 DIAGNOSIS — I10 Essential (primary) hypertension: Secondary | ICD-10-CM | POA: Diagnosis not present

## 2019-07-15 DIAGNOSIS — I471 Supraventricular tachycardia: Secondary | ICD-10-CM

## 2019-07-15 MED ORDER — METOPROLOL SUCCINATE ER 100 MG PO TB24: 100.0000 mg | ORAL_TABLET | Freq: Every day | ORAL | 3 refills | Status: DC

## 2019-07-15 NOTE — Patient Instructions (Signed)
Medication Instructions:  1- INCREASE Toprol 1 tablet (100 mg total) once daily *If you need a refill on your cardiac medications before your next appointment, please call your pharmacy*  Lab Work: None ordered  If you have labs (blood work) drawn today and your tests are completely normal, you will receive your results only by: Marland Kitchen MyChart Message (if you have MyChart) OR . A paper copy in the mail If you have any lab test that is abnormal or we need to change your treatment, we will call you to review the results.  Testing/Procedures: 1- Renal Artery u/s Your physician has requested that you have a renal artery duplex. During this test, an ultrasound is used to evaluate blood flow to the kidneys. Allow one hour for this exam. Do not eat after midnight the day before and avoid carbonated beverages. Take your medications as you usually do.    Follow-Up: At [facility], you and your health needs are our priority.  As part of our continuing mission to provide you with exceptional heart care, we have created designated Provider Care Teams.  These Care Teams include your primary Cardiologist (physician) and Advanced Practice Providers (APPs -  Physician Assistants and Nurse Practitioners) who all work together to provide you with the care you need, when you need it.  Your next appointment:   1 month(s)  The format for your next appointment:   In Person  Provider:    You may see Nelva Bush, MD or Christell Faith, PA-C.

## 2019-07-30 ENCOUNTER — Other Ambulatory Visit: Payer: Self-pay | Admitting: Internal Medicine

## 2019-07-31 ENCOUNTER — Other Ambulatory Visit: Payer: Self-pay | Admitting: Physician Assistant

## 2019-07-31 DIAGNOSIS — I10 Essential (primary) hypertension: Secondary | ICD-10-CM

## 2019-08-01 ENCOUNTER — Encounter: Payer: Self-pay | Admitting: Nurse Practitioner

## 2019-08-01 ENCOUNTER — Other Ambulatory Visit: Payer: Self-pay

## 2019-08-01 ENCOUNTER — Ambulatory Visit: Payer: Medicare Other | Admitting: Nurse Practitioner

## 2019-08-01 VITALS — BP 124/80 | HR 89 | Temp 98.6°F | Resp 16 | Ht 62.0 in | Wt 134.0 lb

## 2019-08-01 DIAGNOSIS — H6123 Impacted cerumen, bilateral: Secondary | ICD-10-CM | POA: Diagnosis not present

## 2019-08-01 NOTE — Progress Notes (Signed)
Careteam: Patient Care Team: Marinda Elk, MD as PCP - General (Physician Assistant) End, Harrell Gave, MD as PCP - Cardiology (Cardiology) Kirk Ruths, MD (Internal Medicine) Bary Castilla Forest Gleason, MD (General Surgery)  Advanced Directive information Does Patient Have a Medical Advance Directive?: Yes, Type of Advance Directive: [place], Does patient want to make changes to medical advance directive?: No - Patient declined  No Known Allergies  Chief Complaint  Patient presents with  . Acute Visit    Curette Both Eyes     HPI: Patient is a 74 y.o. female seen in today at the [facility] to have ears cleaned out. 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 She saw ENT 2 months ago to have them cleaned out. She has increased fullness and itching and that when she knows it time to have them cleaned out. Likes to have the wax removed before wax build up is too much has it makes it very hard for her to tolerate.  Wears hearing aides and has hearing loss at baseline but worse with increase wax build up.   Review of Systems:  Review of Systems  Constitutional: Negative for chills and fever.  HENT: Positive for hearing loss. Negative for ear pain and tinnitus.        Itchy ears    Past Medical History:  Diagnosis Date  . Angina at rest [facility])   . Arthritis   . Breast cancer ([place]) 2016   DCIS, ER negative, PR negative, high-grade. 3.5 cm. 1 mm deep margin (pectoralis fascia). MammoSite radiation.  . Cancer ([place]) 03/26/2015   DCIS, ER negative, PR negative, high-grade. 3.5 cm. 1 mm deep margin (pectoralis fascia).  . Osteoarthritis   . Osteoporosis   . Personal history of radiation therapy 2016   BREAST CA  . PONV (postoperative nausea and vomiting)   . SVT (supraventricular tachycardia) ([place]) 2012  . Trigeminal neuralgia    Past Surgical History:  Procedure Laterality Date  . BREAST  BIOPSY Right 1985   Negative  . BREAST BIOPSY Right 03/02/2015   DCIS  . BREAST CYST ASPIRATION Right 1985   Negative  . [place]   . BREAST LUMPECTOMY Right 03/26/2015   Procedure: BREAST LUMPECTOMY;  Surgeon: Robert Bellow, MD;  Location: [facility] ORS;  Service: General;  Laterality: Right;  . BREAST MAMMOSITE Right 04/28/2015  . BUNIONECTOMY WITH HAMMERTOE RECONSTRUCTION Left 2012  . COLONOSCOPY  2013   Dr Vira Agar  . CRANIOPLASTY  2014  . [place]  2013  . LEFT HEART CATH AND CORONARY ANGIOGRAPHY N/A 11/16/2018   Procedure: LEFT HEART CATH AND CORONARY ANGIOGRAPHY;  Surgeon: Nelva Bush, MD;  Location: [place] [facility];  Service: Cardiovascular;  Laterality: N/A;  . VAGINAL HYSTERECTOMY  1978   Social History:   reports that she has never smoked. She has never used smokeless tobacco. She reports current alcohol use of about 7.0 standard drinks of alcohol per week. She reports that she does not use drugs.  Family History  Problem Relation Age of Onset  . Heart disease Father        died of heart attack and stroke at 22  . Alzheimer's disease Mother   . Breast cancer Paternal Aunt        mid 40's  . Heart disease Maternal Grandfather   . Heart disease Paternal Grandmother  Medications: Patient's Medications  New Prescriptions   No medications on file  Previous Medications   ACETAMINOPHEN (TYLENOL) 500 MG TABLET    Take 500 mg by mouth every 6 (six) hours as needed.   ALENDRONATE (FOSAMAX) 70 MG TABLET    Take 70 mg by mouth once a week. Friday   ASPIRIN EC 81 MG EC TABLET    Take 1 tablet (81 mg total) by mouth daily.   BIOTIN 5 MG TBDP    Take 1 tablet by mouth daily.    CHOLECALCIFEROL (VITAMIN D) 25 MCG (1000 UT) TABLET    Take 5,000 Units by mouth daily.    CYANOCOBALAMIN (VITAMIN B-12) 1000 MCG SUBL    Take 1 tablet by mouth daily.    ESCITALOPRAM (LEXAPRO) 10 MG TABLET    Take 5 mg by mouth  daily. 1/2 tablet = 5mg    GABAPENTIN (NEURONTIN) 100 MG CAPSULE    Take 100 mg by mouth daily as needed.    GLUCOSAMINE-CHONDROIT-VIT C-MN (GLUCOSAMINE CHONDR 1500 COMPLX) CAPS    Take 2 capsules by mouth daily.    LAMOTRIGINE (LAMICTAL) 100 MG TABLET    Take 125-150 mg by mouth 2 (two) times daily. 125 mg QAM, 150 QPM   LAMOTRIGINE (LAMICTAL) 25 MG TABLET    Take 25 mg by mouth daily.   LORAZEPAM (ATIVAN) 0.5 MG TABLET    Take 0.5 mg by mouth daily.   LOSARTAN (COZAAR) 50 MG TABLET    Take 1 tablet (50 mg total) by mouth daily.   LOSARTAN (COZAAR) 50 MG TABLET    Take 1 tablet by mouth daily.   LUTEIN 40 MG CAPS    Take 1 capsule by mouth daily.    METOPROLOL SUCCINATE (TOPROL-XL) 100 MG 24 HR TABLET    Take 1 tablet (100 mg total) by mouth daily. Take with or immediately following a meal as directed   MULTIPLE VITAMIN (MULTIVITAMIN) CAPSULE    Take 1 capsule by mouth daily.   NITROGLYCERIN (NITROSTAT) 0.4 MG SL TABLET    Place 1 tablet (0.4 mg total) under the tongue every 5 (five) minutes as needed for chest pain.   NON FORMULARY    Take 700 mg by mouth daily. Garlic and ginger   PSYLLIUM (REGULOID) 0.52 G CAPSULE    Take 2,625 mg by mouth daily.   PYRIDOXINE (VITAMIN B-6) 50 MG TABLET    Take 50 mg by mouth daily.   ROSUVASTATIN (CRESTOR) 40 MG TABLET    Take 1 tablet (40 mg total) by mouth daily.   VITAMIN C (ASCORBIC ACID) 500 MG TABLET    Take 500 mg by mouth daily.   Modified Medications   No medications on file  Discontinued Medications   No medications on file    Physical Exam:  Vitals:   08/01/19 0928  BP: 124/80  Pulse: 89  Resp: 16  Temp: 98.6 F (37 C)  SpO2: 99%  Weight: 134 lb (60.8 kg)  Height: 5\' 2"  (1.575 m)   Body mass index is 24.51 kg/m. Wt Readings from Last 3 Encounters:  08/01/19 134 lb (60.8 kg)  07/15/19 132 lb 8 oz (60.1 kg)  06/05/19 131 lb 12.8 oz (59.8 kg)    Physical Exam Constitutional:      Appearance: Normal appearance.  HENT:      Head: Normocephalic and atraumatic.     Right Ear: Tympanic membrane, ear canal and external ear normal.     Left Ear: Tympanic membrane, ear  canal and external ear normal.     Ears:     Comments: Increased wax bilaterally  Neurological:     Mental Status: She is alert.     Labs reviewed: Basic Metabolic Panel: Recent Labs    11/13/18 0230 06/12/19 1109 06/26/19 0954  NA 139 138 138  K 4.0 5.2* 4.5  CL 102 102 100  CO2 27 28 28   GLUCOSE 120* 87 138*  BUN 23 28* 19  CREATININE 0.73 0.75 0.76  CALCIUM 8.9 9.5 9.7   Liver Function Tests: Recent Labs    01/31/19 0849  AST 31  ALT 28  ALKPHOS 49  BILITOT 0.6  PROT 6.6  ALBUMIN 4.3   No results for input(s): LIPASE, AMYLASE in the last 8760 hours. No results for input(s): AMMONIA in the last 8760 hours. CBC: Recent Labs    11/13/18 0230  WBC 7.1  HGB 13.3  HCT 41.5  MCV 96.1  PLT 243   Lipid Panel: Recent Labs    11/13/18 0815 01/31/19 0849  CHOL 293* 164  HDL 89 82  LDLCALC 186* 66  TRIG 91 78  CHOLHDL 3.3 2.0   TSH: No results for input(s): TSH in the last 8760 hours. A1C: No results found for: HGBA1C   Assessment/Plan 1. Bilateral impacted cerumen -removed wax via curette bilaterally, pt tolerated well.    Carlos American. [place], [facility] [phone]

## 2019-08-02 ENCOUNTER — Ambulatory Visit: Payer: Medicare Other | Admitting: Physician Assistant

## 2019-08-09 ENCOUNTER — Other Ambulatory Visit: Payer: Self-pay

## 2019-08-09 ENCOUNTER — Ambulatory Visit (INDEPENDENT_AMBULATORY_CARE_PROVIDER_SITE_OTHER): Payer: Medicare Other

## 2019-08-09 DIAGNOSIS — I10 Essential (primary) hypertension: Secondary | ICD-10-CM

## 2019-08-12 ENCOUNTER — Telehealth: Payer: Self-pay

## 2019-08-12 NOTE — Telephone Encounter (Signed)
Call to patient to discuss results and POC follow renal u/s.   Pt verbalized understanding of results.   No further questions at this time.   Confirmed follow up later this week in office with Dr. Saunders Revel to discuss next steps.   She denied any gas or abd discomfort.

## 2019-08-12 NOTE — Telephone Encounter (Signed)
-----   Message from Theora Gianotti, NP sent at 08/09/2019  4:49 PM EDT ----- No evidence of renal artery blockage.  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  If she experiences abdominal discomfort, she should be referred to vascular surgery for further eval/imaging.

## 2019-08-13 ENCOUNTER — Encounter: Payer: Self-pay | Admitting: Physician Assistant

## 2019-08-13 ENCOUNTER — Other Ambulatory Visit: Payer: Self-pay

## 2019-08-13 ENCOUNTER — Ambulatory Visit (INDEPENDENT_AMBULATORY_CARE_PROVIDER_SITE_OTHER): Payer: Medicare Other | Admitting: Physician Assistant

## 2019-08-13 VITALS — BP 130/80 | HR 78 | Ht 62.0 in | Wt 134.0 lb

## 2019-08-13 DIAGNOSIS — I471 Supraventricular tachycardia, unspecified: Secondary | ICD-10-CM

## 2019-08-13 DIAGNOSIS — I2510 Atherosclerotic heart disease of native coronary artery without angina pectoris: Secondary | ICD-10-CM | POA: Diagnosis not present

## 2019-08-13 DIAGNOSIS — E785 Hyperlipidemia, unspecified: Secondary | ICD-10-CM

## 2019-08-13 DIAGNOSIS — I10 Essential (primary) hypertension: Secondary | ICD-10-CM

## 2019-08-13 DIAGNOSIS — I771 Stricture of artery: Secondary | ICD-10-CM

## 2019-08-13 DIAGNOSIS — G500 Trigeminal neuralgia: Secondary | ICD-10-CM

## 2019-08-13 DIAGNOSIS — I774 Celiac artery compression syndrome: Secondary | ICD-10-CM

## 2019-08-13 NOTE — Patient Instructions (Signed)
Medication Instructions:  Your physician recommends that you continue on your current medications as directed. Please refer to the Current Medication list given to you today.  *If you need a refill on your cardiac medications before your next appointment, please call your pharmacy*   Lab Work: None ordered If you have labs (blood work) drawn today and your tests are completely normal, you will receive your results only by: . MyChart Message (if you have MyChart) OR . A paper copy in the mail If you have any lab test that is abnormal or we need to change your treatment, we will call you to review the results.   Testing/Procedures: None ordered   Follow-Up: At [facility], you and your health needs are our priority.  As part of our continuing mission to provide you with exceptional heart care, we have created designated Provider Care Teams.  These Care Teams include your primary Cardiologist (physician) and Advanced Practice Providers (APPs -  Physician Assistants and Nurse Practitioners) who all work together to provide you with the care you need, when you need it.  We recommend signing up for the patient portal called "MyChart".  Sign up information is provided on this After Visit Summary.  MyChart is used to connect with patients for Virtual Visits (Telemedicine).  Patients are able to view lab/test results, encounter notes, upcoming appointments, etc.  Non-urgent messages can be sent to your provider as well.   To learn more about what you can do with MyChart, go to [email or web address].    Your next appointment:   6 month(s)  The format for your next appointment:   In Person  Provider:     You may see Christopher End, MD or Jacquelyn Visser, PA-C   

## 2019-08-13 NOTE — Progress Notes (Signed)
Office Visit    Patient Name: Kristin Davies Date of Encounter: 08/13/2019  Primary Care Provider:  Marinda Elk, MD Primary Cardiologist:  Nelva Bush, MD  Chief Complaint    Chief Complaint  Patient presents with  . other    1 month follow up and discuss renal u/s results. Meds reviewed by the pt. verbally. Pt. c/o pain in legs when walking with mostly in the right leg.     74 yo female with history of mild to moderate nonobstructive CAD by [facility] 11/16/2018, PSVT, HTN, HLD, meningioma, DCIS of the breast, trigeminal neuralgia, and who present for follow-up of BP.   Past Medical History    Past Medical History:  Diagnosis Date  . Angina at rest [facility])   . Arthritis   . Breast cancer ([place]) 2016   DCIS, ER negative, PR negative, high-grade. 3.5 cm. 1 mm deep margin (pectoralis fascia). MammoSite radiation.  . Cancer ([place]) 03/26/2015   DCIS, ER negative, PR negative, high-grade. 3.5 cm. 1 mm deep margin (pectoralis fascia).  . Osteoarthritis   . Osteoporosis   . Personal history of radiation therapy 2016   BREAST CA  . PONV (postoperative nausea and vomiting)   . SVT (supraventricular tachycardia) ([place]) 2012  . Trigeminal neuralgia    Past Surgical History:  Procedure Laterality Date  . BREAST BIOPSY Right 1985   Negative  . BREAST BIOPSY Right 03/02/2015   DCIS  . BREAST CYST ASPIRATION Right 1985   Negative  . [place]   . BREAST LUMPECTOMY Right 03/26/2015   Procedure: BREAST LUMPECTOMY;  Surgeon: Robert Bellow, MD;  Location: [facility] ORS;  Service: General;  Laterality: Right;  . BREAST MAMMOSITE Right 04/28/2015  . BUNIONECTOMY WITH HAMMERTOE RECONSTRUCTION Left 2012  . COLONOSCOPY  2013   Dr Vira Agar  . CRANIOPLASTY  2014  . [place]  2013  . LEFT HEART CATH AND CORONARY ANGIOGRAPHY N/A 11/16/2018   Procedure: LEFT HEART CATH AND CORONARY ANGIOGRAPHY;  Surgeon: Nelva Bush, MD;   Location: [facility];  Service: Cardiovascular;  Laterality: N/A;  . VAGINAL HYSTERECTOMY  1978    Allergies  No Known Allergies  History of Present Illness    Kristin Davies is a 74 y.o. female with PMH as above.  She underwent ETT in 2007 for CP without significant findings. She was maintained on metoprolol for several years given paroxysmal SVT. In 10/2018, she was admitted with CP that woke her from sleep, which was described as indigestion that radiated upwards towards the left shoulder and neck.  BP was elevated with SBP 130s to 180s.  EKG was without acute changes and troponin negative x4.  She underwent MPI that showed a moderate size, mild in severity, and reversible defect involving the mid and apical anterior/anterior lateral septals concerning for ischemia though artifact could not be excluded (breast attenuation and misregistration).  LVSF normal and greater than 65%.  Borderline transient ischemic dilation noted.  Coronary and aortic atherosclerosis calcification noted per CT images. This was ruled a potentionally high risk MPI.  LHC was subsequently performed as an outpatient on 11/16/2018, which showed mild to moderate nonobstructive CAD as outlined in CV studies below and recommendation for medical management.  She has a history of headaches on Imdur, and per EMR, has responded well to titration of Toprol and losartan for BP control.  She was seen by her primary cardiologist 06/03/2019 and was  tolerating her medication changes.  Labs showed stable renal function and electrolytes.  She was seen 07/15/2019  with SBP 130s and 140s.  Clinic BP suboptimal at 154/90. She reported taking her medications approximately 10 minutes prior to her appointment, which she felt contributed to this elevated pressure. Toprol-XL was titrated to 100 mg daily with losartan 50 mg daily. Renal artery ultrasound was performed to evaluate for renal artery stenosis with study showing celiac artery stenosis. She  was continued on ASA, metoprolol, and Crestor. She had recently been started on medication for mild anxiety.  She was following recommendations for low-sodium and low-fat diet.  She was exercising regularly and as the colder weather allowed. Recommendation was for recheck of lipids and addition of Zetia if LDL not at goal below 70. Most recent 01/2019 LDL below 70 at 66 though per CareEverywhere subsequent 03/2019 LDL 88 and will continue to monitor and titrate statin as needed.   She returns to clinic today and reports she is doing well from a cardiac standpoint. She denies any significant chest pain, palpitations, racing heart rate, presyncope, or syncope. She denies any recent falls. She does note bilateral lower extremity pain, mostly noted in the right leg. She denies any other symptoms consistent with PAD, including temperature or color changes. She denies any significant prolonged loss of sensation in her lower extremity. No reported reduced blood flow and pulses on exam noted to be 2+ and found easily. She reports that the pain starts in the region of her lower back and travels down each leg though most notable in her right leg. She reports that the pain will improve with stretching and activity such as walking. We discussed that relief with activity is very reassuring, and on further review of sx, suspicion for MSK etiology. Exam as below and notable for strong bilateral lower extremity pedal pulses. No concerning color or temperature changes. No report of concerning / reduced sensation changes.  Given her recent dx of celiac artery stenosis, as well as review of PAD sx, she wants to ensure she is not overlooking important symptoms of vascular dz or miss appropriate preventative care or further workup / intervention if indicated. Further recommendations as below. We reviewed her BP today with consideration of patient concern regarding recent study findings and timing of her morning medications. She reports  that she is eager to start walking more outside with the warmer weather. She reports a healthy diet and that she continues to limit her salt and fluid intake. No orthopnea, LEE, PND, abdominal distention, or concerning weight gain. No significant SOB at rest or with exertion and day to day activities. No s/sx of bleeding. She reports medication compliance. Overall, she is doing very well from a cardiac standpoint. Future recommendations as below.  Home Medications    Prior to Admission medications   Medication Sig Start Date End Date Taking? Authorizing Provider  acetaminophen (TYLENOL) 500 MG tablet Take 500 mg by mouth every 6 (six) hours as needed.    [provider]  alendronate (FOSAMAX) 70 MG tablet Take 70 mg by mouth once a week. Friday 10/27/15   [provider]  aspirin EC 81 MG EC tablet Take 1 tablet (81 mg total) by mouth daily. 11/14/18   Gladstone Lighter, MD  Biotin 5 MG TBDP Take 1 tablet by mouth daily.     [provider]  cholecalciferol (VITAMIN D) 25 MCG (1000 UT) tablet Take 5,000 Units by mouth daily.     [provider]  Cyanocobalamin (VITAMIN B-12) 1000 MCG SUBL Take 1 tablet by mouth daily.     [provider]  escitalopram (LEXAPRO) 10 MG tablet Take 5 mg by mouth daily. 1/2 tablet = 19m 07/31/19   [provider]  gabapentin (NEURONTIN) 100 MG capsule Take 100 mg by mouth daily as needed.  02/08/19 02/08/20  [provider]  Glucosamine-Chondroit-Vit C-Mn (GLUCOSAMINE CHONDR 1500 COMPLX) CAPS Take 2 capsules by mouth daily.     [provider]  lamoTRIgine (LAMICTAL) 100 MG tablet Take 125-150 mg by mouth 2 (two) times daily. 125 mg QAM, 150 QPM 11/21/16   [provider]  lamoTRIgine (LAMICTAL) 25 MG tablet Take 25 mg by mouth daily. 07/20/19   [provider]  LORazepam (ATIVAN) 0.5 MG tablet Take 0.5 mg by mouth daily.    [provider]  losartan (COZAAR) 50 MG tablet Take 1  tablet (50 mg total) by mouth daily. 07/01/19   End, CHarrell Gave MD  losartan (COZAAR) 50 MG tablet Take 1 tablet by mouth daily. 07/01/19   [provider]  Lutein 40 MG CAPS Take 1 capsule by mouth daily.     [provider]  metoprolol succinate (TOPROL-XL) 100 MG 24 hr tablet Take 1 tablet (100 mg total) by mouth daily. Take with or immediately following a meal as directed 07/15/19 10/13/19  DRise Mu PA-C  Multiple Vitamin (MULTIVITAMIN) capsule Take 1 capsule by mouth daily.    [provider]  nitroGLYCERIN (NITROSTAT) 0.4 MG SL tablet Place 1 tablet (0.4 mg total) under the tongue every 5 (five) minutes as needed for chest pain. 11/13/18   KGladstone Lighter MD  NON FORMULARY Take 700 mg by mouth daily. Garlic and ginger    [provider]  psyllium (REGULOID) 0.52 g capsule Take 2,625 mg by mouth daily.    [provider]  pyridOXINE (VITAMIN B-6) 50 MG tablet Take 50 mg by mouth daily.    [provider]  rosuvastatin (CRESTOR) 40 MG tablet Take 1 tablet (40 mg total) by mouth daily. 04/08/19   End, CHarrell Gave MD  vitamin C (ASCORBIC ACID) 500 MG tablet Take 500 mg by mouth daily.     [provider]    Review of Systems    She denies recent chest pain, palpitations, dyspnea, pnd, orthopnea, n, v, dizziness, syncope, edema, weight gain, or early satiety.  She reports leg pain that occurs mostly in the right leg and improves with activity, suspicious for MSK etiology. All other systems reviewed and are otherwise negative except as noted above.  Physical Exam    VS:  BP 130/80 (BP Location: Left Arm, Patient Position: Sitting, Cuff Size: Normal)   Pulse 78   Ht 5' 2"  (1.575 m)   Wt 134 lb (60.8 kg)   SpO2 97%   BMI 24.51 kg/m  , BMI Body mass index is 24.51 kg/m. GEN: Well nourished, well developed, in no acute distress. Mask in place. HEENT: normal. Neck: Supple, no JVD, carotid bruits, or masses. Cardiac: RRR, no  murmurs, rubs, or gallops. No clubbing, cyanosis, edema.  Radials/DP/PT 2+ and equal bilaterally - easily found on exam. Respiratory:  Respirations regular and unlabored, clear to auscultation bilaterally. GI: Soft, nontender, nondistended, BS + x 4. No abdominal bruit. MS: no deformity or atrophy. Skin: warm and dry, no rash. Neuro:  Strength and sensation are intact. Psych: Normal affect.   Accessory Clinical Findings    ECG personally reviewed by  me today -NSR, 78bpm, LAD and nonspecific ST/T changes as seen in prior tracings with poor R progression in inferior lead III likely 2/2 lead placement / nonspecific and no acute ST/ T changes from that of previous EKG.  VITALS Reviewed today   Temp Readings from Last 3 Encounters:  08/01/19 98.6 F (37 C)  07/15/19 98.1 F (36.7 C)  06/05/19 97.8 F (36.6 C)   BP Readings from Last 3 Encounters:  08/13/19 130/80  08/01/19 124/80  07/15/19 (!) 154/90   Pulse Readings from Last 3 Encounters:  08/13/19 78  08/01/19 89  07/15/19 81    Wt Readings from Last 3 Encounters:  08/13/19 134 lb (60.8 kg)  08/01/19 134 lb (60.8 kg)  07/15/19 132 lb 8 oz (60.1 kg)     LABS  reviewed today   [place] present? Yes/No: Yes see typed CE lab values  Lab Results  Component Value Date   WBC 7.1 11/13/2018   HGB 13.3 11/13/2018   HCT 41.5 11/13/2018   MCV 96.1 11/13/2018   PLT 243 11/13/2018   Lab Results  Component Value Date   CREATININE 0.76 06/26/2019   BUN 19 06/26/2019   NA 138 06/26/2019   K 4.5 06/26/2019   CL 100 06/26/2019   CO2 28 06/26/2019   CE - Sodium 140, potassium 4.6, creatinine 0.8, BUN 20   Lab Results  Component Value Date   ALT 28 01/31/2019   AST 31 01/31/2019   ALKPHOS 49 01/31/2019   BILITOT 0.6 01/31/2019   CE-AST 34, ALT 31, alk phos 44   Lab Results  Component Value Date   CHOL 164 01/31/2019   HDL 82 01/31/2019   LDLCALC 66 01/31/2019   TRIG 78 01/31/2019   CHOLHDL 2.0  01/31/2019   CE 03/2019 (??fasting): Total cholesterol 196, triglycerides 94, HDL 89.5, LDL calculated 88  No results found for: HGBA1C No results found for: TSH  TSH 3.554  STUDIES/PROCEDURES reviewed today   Renal artery study 07/2019 Largest Aortic Diameter: 2.2 cm  Renal:  Right: Normal size right kidney. Normal right Resisitive Index.     Normal cortical thickness of right kidney. No evidence of     right renal artery stenosis. RRV flow present.  Left: No evidence of left renal artery stenosis. Normal size of     left kidney. Normal left Resistive Index. LRV flow present.     Normal cortical thickness of the left kidney.  Mesenteric:  Normal Superior Mesenteric artery findings. 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  1. Mild to moderate, non-obstructive coronary artery disease. 2. Normal left ventricular systolic function and filling pressure. Recommendations: 1. Continue current medical therapy and risk factor modification. Follow-up in the office in ~2 weeks.  10/2018 MPI  Abnormal, potentially high risk pharmacologic myocardial perfusion stress test.  There is a moderate in size, mild in severity, reversible defect involving the mid and apical anterior/anterolateral segments concerning for ischemia though artifact cannot be excluded (breast attenuation and misregistration).  Left ventricular systolic function is normal (LVEF > 65%).  Borderline transient ischemic dilation was noted, which is nonspecific but can be seen with balanced ischemia.  Attenuation correction CT demonstrates coronary artery and aortic atherosclerotic calcification.  Assessment & Plan    HTN -BP improved from previous visit and 130/80.  Patient reports concern regarding recent RAS study, likely contributing to mildly  elevated pressures today. Also, considered is the timing of her  morning medications.  She reports today that she checks her BP daily and 2 hours after she takes her medications with BP well controlled.  Given her controlled home BP and with consideration of improved BP from previous visit, will defer changes in medications and revisit BP at RTC.  If BP still sub-optimal, consider changes to current regimen. For now, continue losartan 50 mg daily andToprol XL 100 mg daily.    Nonobstructive CAD -No signs or symptoms concerning for angina.  Nonobstructive CAD from 10/2018 LHC as outlined above.  Continue risk factor modification and current medical therapy including ASA, metoprolol, and Crestor.  Review of care everywhere labs shows LDLc slightly above goal LDL < 70 (and 2 months after [facility] labs showing controlled LDL below 70).  Consider recheck of cholesterol and adjustment to reach goal LDL <70 with alternative statin with high intensity dosing +/-Zetia or PCSK9i +/- lipid clinic for optimal cholesterol control.  Paroxysmal SVT --Asymptomatic.  Continue Toprol-XL.  Most recent electrolytes and thyroid function without significant findings.  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  Normal superior mesenteric artery findings with 70 to 99% stenosis in the celiac artery. We reviewed the recommendation for conservative treatment of her celiac artery stenosis, especially given her lack of postprandial pain / abdominal discomfort. No abdominal bruit appreciated on exam today.  --We will continue risk factor modification with ASA and statin.  Previous LDL Per review of [facility] labs at goal of below 70.  However, on review of most recent care everywhere labs (which, are actually more recent by 2 months), LDL slightly above goal.  Recommend recheck lipids on current statin and make adjustments to statin therapy +/- Zetia +/- PCSK9i  +/- lipid clinic as needed and for optimal cholesterol control.  Continue to recommend ongoing BP control with goal BP below 130/80.  In addition, recommend heart rate control with current medications given paroxysmal tachycardia.    Lower extremity pain, R>L, likely MSK etiology --Reports lower extremity pain that is worse on her right side and improves with activity.  No reported color changes, temperature changes, numbness or significant loss of sensation.  No noted loss of blood flow with physical exam today significant for 2+ pulses. Not consistent with PAD etiology. Suspicion is for MSK etiology (consider sciatica) given symptoms reported and improvement with stretching and activity.  Discussed strengthening exercises of abdomen and lower back, as well as stretching. She reports improvement with alternation of ice and heat. She plans to research recommendations to ensure lower back support / proper posture and prevent tightening of the muscles and irritation of the nerve.   Medication changes: None. Labs ordered: None. Studies / Imaging ordered: None. Future considerations: Repeat lipid and liver labs with goal LDL <70 and alternative high intensity statin if needed +/-Zetia or PCSK9i +/- lipid clinic for optimal cholesterol control as indicated. Reassess BP at RTC and adjust antihypertensive regimen if indicated.  Reassess postprandial pain/abdominal discomfort with referral to vascular surgery if symptoms of abdominal discomfort. Disposition: RTC in 6 months or sooner if needed.  Total time spent with patient today 28 minutes. This includes reviewing records, evaluating the patient, and coordinating care. Face-to-face time >50%.    Arvil Chaco, PA-C 08/13/2019

## 2019-08-15 ENCOUNTER — Other Ambulatory Visit: Payer: Self-pay | Admitting: Internal Medicine

## 2019-08-16 ENCOUNTER — Other Ambulatory Visit: Payer: Self-pay

## 2019-08-16 ENCOUNTER — Ambulatory Visit: Payer: Medicare Other | Admitting: Internal Medicine

## 2019-08-16 MED ORDER — ROSUVASTATIN CALCIUM 40 MG PO TABS: 40.0000 mg | ORAL_TABLET | Freq: Every day | ORAL | 3 refills | Status: DC

## 2019-08-21 NOTE — Addendum Note (Signed)
Addended by: Raelene Bott, Sathvika Ojo L on: 08/21/2019 10:20 AM   Modules accepted: Orders

## 2019-11-21 ENCOUNTER — Other Ambulatory Visit: Payer: Self-pay

## 2019-11-21 MED ORDER — LOSARTAN POTASSIUM 50 MG PO TABS: 50.0000 mg | ORAL_TABLET | Freq: Every day | ORAL | 3 refills | Status: DC

## 2020-01-30 ENCOUNTER — Other Ambulatory Visit: Payer: Self-pay

## 2020-01-30 DIAGNOSIS — D0511 Intraductal carcinoma in situ of right breast: Secondary | ICD-10-CM

## 2020-02-11 NOTE — Progress Notes (Signed)
Follow-up Outpatient Visit Date: 02/12/2020  Primary Care Provider: Marinda Elk, MD [name] [id number]  Chief Complaint: Follow-up CAD and hypertension  HPI:  Kristin Davies is a 74 y.o. female with history of nonobstructive coronary artery disease, PSVT, hyperlipidemia, meningioma, DCIS of the breast, and trigeminal neuralgia, who presents for follow-up of CAD, PSVT, and hypertension.  She was last seen in our office in March by Marrianne Mood, PA, at which time she was doing, though she noted bilateral leg pain, right greater than the left.  This was felt to be musculoskeletal in nature.  Blood pressure was reasonably well controlled at that time.  Today, [name] reports she has been doing relatively well, denying chest pain, shortness of breath, palpitations, and edema.  Her blood pressure has been well controlled at home.  She fractured a left toe last week (she does not recall exactly how it happened).  She recently joined the new program to help lose weight.  Her biggest limiting factor remains arthritis and back pain.  Mild transaminitis was noted in May, at which time she was taking quite a bit of acetaminophen for her arthralgias.  She has tried cutting back on this and is also using naproxen now on an intermittent basis.  --------------------------------------------------------------------------------------------------  Past Medical History:  Diagnosis Date  . Angina at rest [facility])   . Arthritis   . Breast cancer ([place]) 2016   DCIS, ER negative, PR negative, high-grade. 3.5 cm. 1 mm deep margin (pectoralis fascia). MammoSite radiation.  . Cancer ([place]) 03/26/2015   DCIS, ER negative, PR negative, high-grade. 3.5 cm. 1 mm deep margin (pectoralis fascia).  . Osteoarthritis   . Osteoporosis   . Personal history of radiation therapy 2016   BREAST CA  . PONV (postoperative nausea and vomiting)   . SVT (supraventricular  tachycardia) ([place]) 2012  . Trigeminal neuralgia    Past Surgical History:  Procedure Laterality Date  . BREAST BIOPSY Right 1985   Negative  . BREAST BIOPSY Right 03/02/2015   DCIS  . BREAST CYST ASPIRATION Right 1985   Negative  . [place]   . BREAST LUMPECTOMY Right 03/26/2015   Procedure: BREAST LUMPECTOMY;  Surgeon: Robert Bellow, MD;  Location: [facility] ORS;  Service: General;  Laterality: Right;  . BREAST MAMMOSITE Right 04/28/2015  . BUNIONECTOMY WITH HAMMERTOE RECONSTRUCTION Left 2012  . CARDIAC CATHETERIZATION    . COLONOSCOPY  2013   Dr Vira Agar  . CRANIOPLASTY  2014  . [place]  2013  . LEFT HEART CATH AND CORONARY ANGIOGRAPHY N/A 11/16/2018   Procedure: LEFT HEART CATH AND CORONARY ANGIOGRAPHY;  Surgeon: Nelva Bush, MD;  Location: [place] [facility];  Service: Cardiovascular;  Laterality: N/A;  . VAGINAL HYSTERECTOMY  1978    Current Meds  Medication Sig  . acetaminophen (TYLENOL) 500 MG tablet Take 500 mg by mouth every 6 (six) hours as needed.  Marland Kitchen alendronate (FOSAMAX) 70 MG tablet Take 70 mg by mouth once a week. Friday  . aspirin EC 81 MG EC tablet Take 1 tablet (81 mg total) by mouth daily.  . Biotin 5 MG TBDP Take 1 tablet by mouth daily.   . cholecalciferol (VITAMIN D) 25 MCG (1000 UT) tablet Take 5,000 Units by mouth daily.   . Cyanocobalamin (VITAMIN B-12) 1000 MCG SUBL Take 1 tablet by mouth daily.   Marland Kitchen escitalopram (LEXAPRO) 10 MG tablet Take 5  mg by mouth daily. 1/2 tablet = 5mg   . Glucosamine-Chondroit-Vit C-Mn (GLUCOSAMINE CHONDR 1500 COMPLX) CAPS Take 2 capsules by mouth daily.   Marland Kitchen lamoTRIgine (LAMICTAL) 100 MG tablet Take 125-150 mg by mouth 2 (two) times daily. 125 mg QAM, 150 QPM  . lamoTRIgine (LAMICTAL) 25 MG tablet Take 25 mg by mouth daily.  Marland Kitchen losartan (COZAAR) 50 MG tablet Take 1 tablet (50 mg total) by mouth daily.  . Lutein 40 MG CAPS Take 1 capsule by mouth daily.   .  metoprolol succinate (TOPROL-XL) 100 MG 24 hr tablet Take 1 tablet (100 mg total) by mouth daily. Take with or immediately following a meal as directed  . Multiple Vitamin (MULTIVITAMIN) capsule Take 1 capsule by mouth daily.  . nitroGLYCERIN (NITROSTAT) 0.4 MG SL tablet Place 1 tablet (0.4 mg total) under the tongue every 5 (five) minutes as needed for chest pain.  . NON FORMULARY Take 700 mg by mouth daily. Garlic and ginger  . psyllium (REGULOID) 0.52 g capsule Take 2,625 mg by mouth daily.  Marland Kitchen pyridOXINE (VITAMIN B-6) 50 MG tablet Take 50 mg by mouth daily.  . rosuvastatin (CRESTOR) 40 MG tablet Take 1 tablet (40 mg total) by mouth daily.  . vitamin C (ASCORBIC ACID) 500 MG tablet Take 500 mg by mouth daily.     Allergies: Patient has no known allergies.  Social History   Tobacco Use  . Smoking status: Never Smoker  . Smokeless tobacco: Never Used  Vaping Use  . Vaping Use: Never used  Substance Use Topics  . Alcohol use: Yes    Alcohol/week: 7.0 standard drinks    Types: 7 Glasses of wine per week    Comment: 5 oz every night  . Drug use: No    Family History  Problem Relation Age of Onset  . Heart disease Father        died of heart attack and stroke at 109  . Alzheimer's disease Mother   . Breast cancer Paternal Aunt        mid 86's  . Heart disease Maternal Grandfather   . Heart disease Paternal Grandmother     Review of Systems: A 12-system review of systems was performed and was negative except as noted in the HPI.  --------------------------------------------------------------------------------------------------  Physical Exam: BP (!) 150/90 (BP Location: Left Arm, Patient Position: Sitting, Cuff Size: Normal)   Pulse 74   Ht 5' 1.75" (1.568 m)   Wt 130 lb (59 kg)   SpO2 96%   BMI 23.97 kg/m   Repeat BP: 138/76  General: NAD. HEENT: No conjunctival pallor or scleral icterus. Facemask in place. Neck: No JVD or HJR. Lungs: Normal work of breathing. Clear  to auscultation bilaterally without wheezes or crackles. Heart: Regular rate and rhythm with 2/6 systolic murmur.  No rubs or gallops. Abd: Bowel sounds present. Soft, NT/ND. Ext: No lower extremity edema.  EKG: Normal sinus rhythm with left axis deviation.  Otherwise, no significant abnormality.  No significant change from prior tracing on 08/13/2019.  Lab Results  Component Value Date   WBC 7.1 11/13/2018   HGB 13.3 11/13/2018   HCT 41.5 11/13/2018   MCV 96.1 11/13/2018   PLT 243 11/13/2018    Lab Results  Component Value Date   NA 138 06/26/2019   K 4.5 06/26/2019   CL 100 06/26/2019   CO2 28 06/26/2019   BUN 19 06/26/2019   CREATININE 0.76 06/26/2019   GLUCOSE 138 (H) 06/26/2019   ALT  28 01/31/2019    Lab Results  Component Value Date   CHOL 164 01/31/2019   HDL 82 01/31/2019   LDLCALC 66 01/31/2019   TRIG 78 01/31/2019   CHOLHDL 2.0 01/31/2019    --------------------------------------------------------------------------------------------------  ASSESSMENT AND PLAN: Hypertension: Blood pressure mildly elevated today (improved on recheck) but under much better control at home.  We have agreed to continue Ms. Milner's current medications.  I encouraged her to keep working on lifestyle modifications including sodium restriction.  Heart murmur: 2/6 systolic murmur appreciated on examination today.  Given history of hypertension and CAD, I recommended that we obtain an echocardiogram at the patient's convenience.  Nonobstructive coronary artery disease: No chest pain reported.  Continue aspirin and metoprolol as well as lipid therapy.  Hyperlipidemia: LDL at goal.  Continue rosuvastatin 40 mg daily.  Ongoing monitoring of mild transaminitis per Dr. Carrie Mew.  PSVT: No palpitations reported.  Continue metoprolol succinate 100 mg daily.  Follow-up: Return to clinic in 6 months.  Nelva Bush, MD 02/12/2020 9:54 AM

## 2020-02-12 ENCOUNTER — Other Ambulatory Visit: Payer: Self-pay

## 2020-02-12 ENCOUNTER — Ambulatory Visit: Payer: Medicare Other | Admitting: Internal Medicine

## 2020-02-12 ENCOUNTER — Encounter: Payer: Self-pay | Admitting: Internal Medicine

## 2020-02-12 VITALS — BP 150/90 | HR 74 | Ht 61.75 in | Wt 130.0 lb

## 2020-02-12 DIAGNOSIS — R011 Cardiac murmur, unspecified: Secondary | ICD-10-CM

## 2020-02-12 DIAGNOSIS — I471 Supraventricular tachycardia: Secondary | ICD-10-CM | POA: Diagnosis not present

## 2020-02-12 DIAGNOSIS — I10 Essential (primary) hypertension: Secondary | ICD-10-CM | POA: Diagnosis not present

## 2020-02-12 DIAGNOSIS — I2510 Atherosclerotic heart disease of native coronary artery without angina pectoris: Secondary | ICD-10-CM | POA: Diagnosis not present

## 2020-02-12 NOTE — Patient Instructions (Signed)
Medication Instructions:  Your physician recommends that you continue on your current medications as directed. Please refer to the Current Medication list given to you today.  *If you need a refill on your cardiac medications before your next appointment, please call your pharmacy*  Lab Work: none If you have labs (blood work) drawn today and your tests are completely normal, you will receive your results only by: . MyChart Message (if you have MyChart) OR . A paper copy in the mail If you have any lab test that is abnormal or we need to change your treatment, we will call you to review the results.  Testing/Procedures: Your physician has requested that you have an echocardiogram. Echocardiography is a painless test that uses sound waves to create images of your heart. It provides your doctor with information about the size and shape of your heart and how well your heart's chambers and valves are working. This procedure takes approximately one hour. There are no restrictions for this procedure. You may get an IV, if needed, to receive an ultrasound enhancing agent through to better visualize your heart.   Follow-Up: At [facility], you and your health needs are our priority.  As part of our continuing mission to provide you with exceptional heart care, we have created designated Provider Care Teams.  These Care Teams include your primary Cardiologist (physician) and Advanced Practice Providers (APPs -  Physician Assistants and Nurse Practitioners) who all work together to provide you with the care you need, when you need it.  We recommend signing up for the patient portal called "MyChart".  Sign up information is provided on this After Visit Summary.  MyChart is used to connect with patients for Virtual Visits (Telemedicine).  Patients are able to view lab/test results, encounter notes, upcoming appointments, etc.  Non-urgent messages can be sent to your provider as well.   To learn more about  what you can do with MyChart, go to [email or web address].    Your next appointment:   6 month(s)  The format for your next appointment:   In Person  Provider:   You may see Christopher End, MD or one of the following Advanced Practice Providers on your designated Care Team:    Christopher Berge, NP  Ryan Dunn, PA-C  Jacquelyn Visser, PA-C  Cadence Furth, PA-C   

## 2020-02-13 ENCOUNTER — Encounter: Payer: Self-pay | Admitting: Internal Medicine

## 2020-02-13 DIAGNOSIS — I10 Essential (primary) hypertension: Secondary | ICD-10-CM | POA: Insufficient documentation

## 2020-02-13 DIAGNOSIS — I2510 Atherosclerotic heart disease of native coronary artery without angina pectoris: Secondary | ICD-10-CM | POA: Insufficient documentation

## 2020-02-13 DIAGNOSIS — R011 Cardiac murmur, unspecified: Secondary | ICD-10-CM | POA: Insufficient documentation

## 2020-03-05 ENCOUNTER — Other Ambulatory Visit: Payer: Self-pay

## 2020-03-05 ENCOUNTER — Ambulatory Visit (INDEPENDENT_AMBULATORY_CARE_PROVIDER_SITE_OTHER): Payer: Medicare Other

## 2020-03-05 DIAGNOSIS — R011 Cardiac murmur, unspecified: Secondary | ICD-10-CM | POA: Diagnosis not present

## 2020-03-05 DIAGNOSIS — I2510 Atherosclerotic heart disease of native coronary artery without angina pectoris: Secondary | ICD-10-CM

## 2020-03-05 LAB — ECHOCARDIOGRAM COMPLETE
AR max vel: 1.15 cm2
AV Area VTI: 1.19 cm2
AV Area mean vel: 1.11 cm2
AV Mean grad: 8.3 mmHg
AV Peak grad: 15.6 mmHg
Ao pk vel: 1.98 m/s
Area-P 1/2: 6.71 cm2
Calc EF: 52.7 %
P 1/2 time: 554 msec
S' Lateral: 3 cm
Single Plane A2C EF: 51.8 %
Single Plane A4C EF: 53.5 %

## 2020-03-13 ENCOUNTER — Ambulatory Visit
Admission: RE | Admit: 2020-03-13 | Discharge: 2020-03-13 | Disposition: A | Discharge status: Home / Self Care | Payer: Medicare Other | Source: Ambulatory Visit | Attending: Surgery | Admitting: Surgery

## 2020-03-13 ENCOUNTER — Other Ambulatory Visit: Payer: Self-pay

## 2020-03-13 DIAGNOSIS — D0511 Intraductal carcinoma in situ of right breast: Secondary | ICD-10-CM | POA: Diagnosis not present

## 2020-03-23 ENCOUNTER — Ambulatory Visit: Payer: Medicare Other | Admitting: Surgery

## 2020-03-25 ENCOUNTER — Other Ambulatory Visit: Payer: Self-pay

## 2020-03-25 ENCOUNTER — Encounter: Payer: Self-pay | Admitting: Surgery

## 2020-03-25 ENCOUNTER — Ambulatory Visit: Payer: Medicare Other | Admitting: Surgery

## 2020-03-25 VITALS — BP 146/84 | HR 75 | Temp 98.4°F | Resp 12 | Ht 62.0 in | Wt 129.0 lb

## 2020-03-25 DIAGNOSIS — D0511 Intraductal carcinoma in situ of right breast: Secondary | ICD-10-CM | POA: Diagnosis not present

## 2020-03-25 NOTE — Progress Notes (Signed)
03/25/2020  History of Present Illness: Kristin Davies is a 74 y.o. female presenting for follow up.  She's s/p right breast lumpectomy for DCIS on 03/26/2015.  She's s/p radiation and her mass was ER negative, so no hormonal therapy was done.  She had her most recent mammogram on 03/13/20.  She denies any issues with either breast, particularly no masses, skin changes, or nipple drainage/changes.  She does self exams monthly.  Past Medical History: Past Medical History:  Diagnosis Date  . Angina at rest [facility])   . Arthritis   . Breast cancer ([place]) 2016   DCIS, ER negative, PR negative, high-grade. 3.5 cm. 1 mm deep margin (pectoralis fascia). MammoSite radiation.  . Cancer ([place]) 03/26/2015   DCIS, ER negative, PR negative, high-grade. 3.5 cm. 1 mm deep margin (pectoralis fascia).  . Osteoarthritis   . Osteoporosis   . Personal history of radiation therapy 2016   BREAST CA  . PONV (postoperative nausea and vomiting)   . SVT (supraventricular tachycardia) ([place]) 2012  . Trigeminal neuralgia      Past Surgical History: Past Surgical History:  Procedure Laterality Date  . BREAST BIOPSY Right 1985   Negative  . BREAST BIOPSY Right 03/02/2015   DCIS  . BREAST CYST ASPIRATION Right 1985   Negative  . [place]   . BREAST LUMPECTOMY Right 03/26/2015   Procedure: BREAST LUMPECTOMY;  Surgeon: Robert Bellow, MD;  Location: [facility] ORS;  Service: General;  Laterality: Right;  . BREAST MAMMOSITE Right 04/28/2015  . BUNIONECTOMY WITH HAMMERTOE RECONSTRUCTION Left 2012  . CARDIAC CATHETERIZATION    . COLONOSCOPY  2013   Dr Vira Agar  . CRANIOPLASTY  2014  . [place]  2013  . LEFT HEART CATH AND CORONARY ANGIOGRAPHY N/A 11/16/2018   Procedure: LEFT HEART CATH AND CORONARY ANGIOGRAPHY;  Surgeon: Nelva Bush, MD;  Location: [place] [facility];  Service: Cardiovascular;  Laterality: N/A;  . VAGINAL HYSTERECTOMY  1978    Home  Medications: Prior to Admission medications   Medication Sig Start Date End Date Taking? Authorizing Provider  acetaminophen (TYLENOL) 500 MG tablet Take 500 mg by mouth every 6 (six) hours as needed.   Yes [provider]  alendronate (FOSAMAX) 70 MG tablet Take 70 mg by mouth once a week. Friday 10/27/15  Yes [provider]  aspirin EC 81 MG EC tablet Take 1 tablet (81 mg total) by mouth daily. 11/14/18  Yes Gladstone Lighter, MD  Biotin 5 MG TBDP Take 1 tablet by mouth daily.    Yes [provider]  cholecalciferol (VITAMIN D) 25 MCG (1000 UT) tablet Take 5,000 Units by mouth daily.    Yes [provider]  Cyanocobalamin (VITAMIN B-12) 1000 MCG SUBL Take 1 tablet by mouth daily.    Yes [provider]  escitalopram (LEXAPRO) 10 MG tablet Take 5 mg by mouth daily. 1/2 tablet = 5mg  07/31/19  Yes [provider]  Glucosamine-Chondroit-Vit C-Mn (GLUCOSAMINE CHONDR 1500 COMPLX) CAPS Take 2 capsules by mouth daily.    Yes [provider]  lamoTRIgine (LAMICTAL) 100 MG tablet Take 125-150 mg by mouth 2 (two) times daily. 125 mg QAM, 150 QPM 11/21/16  Yes [provider]  lamoTRIgine (LAMICTAL) 25 MG tablet Take 25 mg by mouth daily. 07/20/19  Yes [provider]  losartan (COZAAR) 50 MG tablet Take 1 tablet (50 mg total) by mouth daily. 11/21/19  Yes Marrianne Mood D, PA-C  Lutein 40 MG CAPS Take 1 capsule by mouth daily.    Yes [provider]  Multiple Vitamin (MULTIVITAMIN) capsule Take 1 capsule by mouth daily.   Yes [provider]  nitroGLYCERIN (NITROSTAT) 0.4 MG SL tablet Place 1 tablet (0.4 mg total) under the tongue every 5 (five) minutes as needed for chest pain. 11/13/18  Yes Gladstone Lighter, MD  NON FORMULARY Take 700 mg by mouth daily. Garlic and ginger   Yes [provider]  psyllium (REGULOID) 0.52 g capsule Take 2,625 mg by mouth daily.   Yes [provider]  pyridOXINE  (VITAMIN B-6) 50 MG tablet Take 50 mg by mouth daily.   Yes [provider]  rosuvastatin (CRESTOR) 40 MG tablet Take 1 tablet (40 mg total) by mouth daily. 08/16/19  Yes End, Harrell Gave, MD  vitamin C (ASCORBIC ACID) 500 MG tablet Take 500 mg by mouth daily.    Yes [provider]  gabapentin (NEURONTIN) 100 MG capsule Take 100 mg by mouth daily as needed.  02/08/19 02/08/20  [provider]  metoprolol succinate (TOPROL-XL) 100 MG 24 hr tablet Take 1 tablet (100 mg total) by mouth daily. Take with or immediately following a meal as directed 07/15/19 02/12/20  Rise Mu, PA-C    Allergies: No Known Allergies  Review of Systems: Review of Systems  Constitutional: Negative for chills and fever.  Respiratory: Negative for shortness of breath.   Cardiovascular: Negative for chest pain.  Gastrointestinal: Negative for abdominal pain, nausea and vomiting.    Physical Exam BP (!) 146/84   Pulse 75   Temp 98.4 F (36.9 C)   Resp 12   Ht 5\' 2"  (1.575 m)   Wt 129 lb (58.5 kg)   SpO2 96%   BMI 23.59 kg/m  CONSTITUTIONAL: No acute distress HEENT:  Normocephalic, atraumatic, extraocular motion intact. RESPIRATORY:  Normal respiratory effort without pathologic use of accessory muscles. CARDIOVASCULAR:  Regular rhythm and rate. BREAST:  Right breast s/p lumpectomy in upper outer quadrant.  No palpable masses, skin changes, or nipple changes.  No right axillary lymphadenopathy.  Left breast without any palpable masses, skin changes, or nipple changes. No left axillary lymphadenopathy. NEUROLOGIC:  Motor and sensation is grossly normal.  Cranial nerves are grossly intact. PSYCH:  Alert and oriented to person, place and time. Affect is normal.  Labs/Imaging: Mammogram 03/13/20: FINDINGS: Right breast: Spot 2D magnification views of the lumpectomy site were performed in addition to standard views. There are stable postsurgical changes. No suspicious mass, distortion,  or microcalcifications are identified to suggest presence of malignancy.  Left breast: No suspicious mass, distortion, or microcalcifications are identified to suggest presence of malignancy.  Mammographic images were processed with CAD.  IMPRESSION: Stable postsurgical appearance of the right breast. No mammographic evidence of malignancy bilaterally.  RECOMMENDATION: Screening mammogram in one year  Assessment and Plan: This is a 74 y.o. female s/p right breast lumpectomy in 2016.  --Patient is doing well and her exam and mammogram are reassuring. --She is now 5 years out from her initial surgery.  She is wondering if she still needs to follow up with [place].  I think at this point, it is ok to defer back to her PCP for future mammograms and breast exams.  Discussed with her that she should still continue with yearly mammograms.  She can always contact us if there are any issues, concerns, or new findings. --Follow up prn.  Face-to-face time spent with the patient and  care providers was 15 minutes, with more than 50% of the time spent counseling, educating, and coordinating care of the patient.     Melvyn Neth, [facility]

## 2020-03-25 NOTE — Patient Instructions (Signed)
You will still need to do yearly mammograms through your primary care provider.   Follow up as needed with our office. Call if you have any questions or concerns.  Breast Self-Awareness Breast self-awareness means being familiar with how your breasts look and feel. It involves checking your breasts regularly and reporting any changes to your health care provider. Practicing breast self-awareness is important. Sometimes changes may not be harmful (are benign), but sometimes a change in your breasts can be a sign of a serious medical problem. It is important to learn how to do this procedure correctly so that you can catch problems early, when treatment is more likely to be successful. All women should practice breast self-awareness, including women who have had breast implants. What you need:  A mirror.  A well-lit room. How to do a breast self-exam A breast self-exam is one way to learn what is normal for your breasts and whether your breasts are changing. To do a breast self-exam: Look for changes  1. Remove all the clothing above your waist. 2. Stand in front of a mirror in a room with good lighting. 3. Put your hands on your hips. 4. Push your hands firmly downward. 5. Compare your breasts in the mirror. Look for differences between them (asymmetry), such as: ? Differences in shape. ? Differences in size. ? Puckers, dips, and bumps in one breast and not the other. 6. Look at each breast for changes in the skin, such as: ? Redness. ? Scaly areas. 7. Look for changes in your nipples, such as: ? Discharge. ? Bleeding. ? Dimpling. ? Redness. ? A change in position. Feel for changes Carefully feel your breasts for lumps and changes. It is best to do this while lying on your back on the floor, and again while sitting or standing in the tub or shower with soapy water on your skin. Feel each breast in the following way: 1. Place the arm on the side of the breast you are examining above  your head. 2. Feel your breast with the other hand. 3. Start in the nipple area and make -inch (2 cm) overlapping circles to feel your breast. Use the pads of your three middle fingers to do this. Apply light pressure, then medium pressure, then firm pressure. The light pressure will allow you to feel the tissue closest to the skin. The medium pressure will allow you to feel the tissue that is a little deeper. The firm pressure will allow you to feel the tissue close to the ribs. 4. Continue the overlapping circles, moving downward over the breast until you feel your ribs below your breast. 5. Move one finger-width toward the center of the body. Continue to use the -inch (2 cm) overlapping circles to feel your breast as you move slowly up toward your collarbone. 6. Continue the up-and-down exam using all three pressures until you reach your armpit.  Write down what you find Writing down what you find can help you remember what to discuss with your health care provider. Write down:  What is normal for each breast.  Any changes that you find in each breast, including: ? The kind of changes you find. ? Any pain or tenderness. ? Size and location of any lumps.  Where you are in your menstrual cycle, if you are still menstruating. General tips and recommendations  Examine your breasts every month.  If you are breastfeeding, the best time to examine your breasts is after a feeding or after  using a breast pump.  If you menstruate, the best time to examine your breasts is 5-7 days after your period. Breasts are generally lumpier during menstrual periods, and it may be more difficult to notice changes.  With time and practice, you will become more familiar with the variations in your breasts and more comfortable with the exam. Contact a health care provider if you:  See a change in the shape or size of your breasts or nipples.  See a change in the skin of your breast or nipples, such as a  reddened or scaly area.  Have unusual discharge from your nipples.  Find a lump or thick area that was not there before.  Have pain in your breasts.  Have any concerns related to your breast health. Summary  Breast self-awareness includes looking for physical changes in your breasts, as well as feeling for any changes within your breasts.  Breast self-awareness should be performed in front of a mirror in a well-lit room.  You should examine your breasts every month. If you menstruate, the best time to examine your breasts is 5-7 days after your menstrual period.  Let your health care provider know of any changes you notice in your breasts, including changes in size, changes on the skin, pain or tenderness, or unusual fluid from your nipples. This information is not intended to replace advice given to you by your health care provider. Make sure you discuss any questions you have with your health care provider. Document Revised: 12/26/2017 Document Reviewed: 12/26/2017 Elsevier Patient Education  [place].

## 2020-05-12 ENCOUNTER — Other Ambulatory Visit: Payer: Self-pay | Admitting: Physician Assistant

## 2020-05-26 ENCOUNTER — Other Ambulatory Visit: Payer: Self-pay | Admitting: Internal Medicine

## 2020-06-17 MED ORDER — METOPROLOL SUCCINATE ER 100 MG PO TB24: ORAL_TABLET | ORAL | 1 refills | Status: DC

## 2020-06-17 MED ORDER — LOSARTAN POTASSIUM 50 MG PO TABS: 50.0000 mg | ORAL_TABLET | Freq: Every day | ORAL | 1 refills | Status: DC

## 2020-07-21 DIAGNOSIS — G629 Polyneuropathy, unspecified: Secondary | ICD-10-CM

## 2020-07-21 HISTORY — DX: Polyneuropathy, unspecified: G62.9

## 2020-08-12 ENCOUNTER — Ambulatory Visit: Payer: Medicare Other | Admitting: Internal Medicine

## 2020-08-19 ENCOUNTER — Other Ambulatory Visit: Payer: Self-pay | Admitting: Internal Medicine

## 2020-08-21 ENCOUNTER — Other Ambulatory Visit: Payer: Self-pay

## 2020-08-21 ENCOUNTER — Ambulatory Visit: Payer: Medicare Other | Admitting: Physician Assistant

## 2020-08-21 ENCOUNTER — Encounter: Payer: Self-pay | Admitting: Physician Assistant

## 2020-08-21 VITALS — BP 130/84 | HR 77 | Ht 62.0 in | Wt 130.0 lb

## 2020-08-21 DIAGNOSIS — R011 Cardiac murmur, unspecified: Secondary | ICD-10-CM | POA: Diagnosis not present

## 2020-08-21 DIAGNOSIS — I10 Essential (primary) hypertension: Secondary | ICD-10-CM | POA: Diagnosis not present

## 2020-08-21 DIAGNOSIS — I771 Stricture of artery: Secondary | ICD-10-CM

## 2020-08-21 DIAGNOSIS — I471 Supraventricular tachycardia: Secondary | ICD-10-CM

## 2020-08-21 DIAGNOSIS — I200 Unstable angina: Secondary | ICD-10-CM

## 2020-08-21 DIAGNOSIS — I700 Atherosclerosis of aorta: Secondary | ICD-10-CM

## 2020-08-21 DIAGNOSIS — Z79899 Other long term (current) drug therapy: Secondary | ICD-10-CM

## 2020-08-21 DIAGNOSIS — G500 Trigeminal neuralgia: Secondary | ICD-10-CM

## 2020-08-21 DIAGNOSIS — I2510 Atherosclerotic heart disease of native coronary artery without angina pectoris: Secondary | ICD-10-CM | POA: Diagnosis not present

## 2020-08-21 DIAGNOSIS — E785 Hyperlipidemia, unspecified: Secondary | ICD-10-CM

## 2020-08-21 DIAGNOSIS — I774 Celiac artery compression syndrome: Secondary | ICD-10-CM

## 2020-08-21 NOTE — Patient Instructions (Signed)
Medication Instructions:  Your physician recommends that you continue on your current medications as directed. Please refer to the Current Medication list given to you today.  *If you need a refill on your cardiac medications before your next appointment, please call your pharmacy*   Lab Work: None ordered  Testing/Procedures: None ordered   Follow-Up: At [facility], you and your health needs are our priority.  As part of our continuing mission to provide you with exceptional heart care, we have created designated Provider Care Teams.  These Care Teams include your primary Cardiologist (physician) and Advanced Practice Providers (APPs -  Physician Assistants and Nurse Practitioners) who all work together to provide you with the care you need, when you need it.  We recommend signing up for the patient portal called "MyChart".  Sign up information is provided on this After Visit Summary.  MyChart is used to connect with patients for Virtual Visits (Telemedicine).  Patients are able to view lab/test results, encounter notes, upcoming appointments, etc.  Non-urgent messages can be sent to your provider as well.   To learn more about what you can do with MyChart, go to [email or web address].    Your next appointment:   4 - 6 month(s)  The format for your next appointment:   In Person  Provider:   You may see Nelva Bush, MD or one of the following Advanced Practice Providers on your designated Care Team:    Murray Hodgkins, NP  Christell Faith, PA-C  Marrianne Mood, PA-C  Cadence [place], [place]  Laurann Montana, NP    Other Instructions  You may take your morning medications with food / yogurt.

## 2020-08-21 NOTE — Progress Notes (Signed)
Office Visit    Patient Name: Kristin Davies Date of Encounter: 08/21/2020  PCP:  Marinda Elk, [place]  Cardiologist:  Nelva Bush, MD  Advanced Practice Provider:  Arvil Chaco, PA-C Electrophysiologist:  None   :21  Chief Complaint    Chief Complaint  Patient presents with  . Follow-up    6 Months follow up. C/o feeling really fatigue and nauseous when taking cardiac medications. Medications verbally reviewed with patient.     75 yo female with history of mild to moderate nonobstructive CAD by [facility] 11/16/2018, PSVT, HTN, HLD, meningioma, DCIS of the breast, trigeminal neuralgia, and who present for follow-up of BP.   Past Medical History    Past Medical History:  Diagnosis Date  . Angina at rest [facility])   . Arthritis   . Breast cancer ([place]) 2016   DCIS, ER negative, PR negative, high-grade. 3.5 cm. 1 mm deep margin (pectoralis fascia). MammoSite radiation.  . Cancer ([place]) 03/26/2015   DCIS, ER negative, PR negative, high-grade. 3.5 cm. 1 mm deep margin (pectoralis fascia).  . Osteoarthritis   . Osteoporosis   . Personal history of radiation therapy 2016   BREAST CA  . PONV (postoperative nausea and vomiting)   . SVT (supraventricular tachycardia) ([place]) 2012  . Trigeminal neuralgia    Past Surgical History:  Procedure Laterality Date  . BREAST BIOPSY Right 1985   Negative  . BREAST BIOPSY Right 03/02/2015   DCIS  . BREAST CYST ASPIRATION Right 1985   Negative  . [place]   . BREAST LUMPECTOMY Right 03/26/2015   Procedure: BREAST LUMPECTOMY;  Surgeon: Robert Bellow, MD;  Location: [facility] ORS;  Service: General;  Laterality: Right;  . BREAST MAMMOSITE Right 04/28/2015  . BUNIONECTOMY WITH HAMMERTOE RECONSTRUCTION Left 2012  . CARDIAC CATHETERIZATION    . COLONOSCOPY  2013   Dr Vira Agar  . CRANIOPLASTY  2014  . [place]  2013  . LEFT HEART CATH AND  CORONARY ANGIOGRAPHY N/A 11/16/2018   Procedure: LEFT HEART CATH AND CORONARY ANGIOGRAPHY;  Surgeon: Nelva Bush, MD;  Location: [facility];  Service: Cardiovascular;  Laterality: N/A;  . VAGINAL HYSTERECTOMY  1978    Allergies  No Known Allergies  History of Present Illness    Kristin Davies is a 75 y.o. female with PMH as above.  She underwent ETT in 2007 for CP without significant findings. She was maintained on metoprolol for several years given paroxysmal SVT. In 10/2018, she was admitted with CP that woke her from sleep, which was described as indigestion that radiated upwards towards the left shoulder and neck.  BP was elevated with SBP 130s to 180s.  EKG was without acute changes and troponin negative x4.  She underwent MPI that showed a moderate size, mild in severity, and reversible defect involving the mid and apical anterior/anterior lateral septals concerning for ischemia though artifact could not be excluded (breast attenuation and misregistration).  LVSF normal and greater than 65%.  Borderline transient ischemic dilation noted.  Coronary and aortic atherosclerosis calcification noted per CT images. This was ruled a potentionally high risk MPI.  LHC was subsequently performed as an outpatient on 11/16/2018, which showed mild to moderate nonobstructive CAD as outlined in CV studies below and recommendation for medical management.  She has a history of headaches on Imdur, and per EMR, has responded well to titration of  Toprol and losartan for BP control.  She was seen by her primary cardiologist 06/03/2019 and was tolerating her medication changes.  Labs showed stable renal function and electrolytes.  She was seen 07/15/2019  with SBP 130s and 140s.  Clinic BP suboptimal at 154/90. She reported taking her medications approximately 10 minutes prior to her appointment, which she felt contributed to this elevated pressure. Toprol-XL was titrated to 100 mg daily with losartan 50 mg  daily. Renal artery ultrasound was performed to evaluate for renal artery stenosis with study showing celiac artery stenosis. She was continued on ASA, metoprolol, and Crestor. She had recently been started on medication for mild anxiety.  She was following recommendations for low-sodium and low-fat diet.  She was exercising regularly and as the colder weather allowed. Recommendation was for recheck of lipids and addition of Zetia if LDL not at goal below 70. Most recent 01/2019 LDL below 70 at 66 though per CareEverywhere subsequent 03/2019 LDL 88 and will continue to monitor and titrate statin as needed.   Seen 08/13/2019 and doing well from a cardiac standpoint.  She had bilateral lower extremity pain, noted mostly in the right leg.  She reported pain in the lower back that travels down each leg and most notable on her right leg.  Pain improved with stretching and activity such as walking.  Suspicion was for MSK etiology.  Strong bilateral pedal pulses noted on exam.  She was eager to start walking outside more in the warmer weather.  She reported heart healthy diet and limiting salt/fluid intake.  No medication changes made.  Seen 02/12/2020 by her primary cardiologist, Dr. Saunders Revel.  She reported a left toe fracture.  She had joined new program to help her lose weight.  She continued to note arthritis and back pain.  Transaminitis noted in May in the setting of acetaminophen and arthralgias.  Echo was ordered and as below with EF 60 to 65%, NR WMA, G1 DD, mild LAE, mild to moderate MR, mild AS.  Today, 08/21/2020, she returns to clinic and notes she is overall doing well from a cardiac standpoint.  She has lost weight since she was last in the clinic.  She reports that she has been very adherent to a DASH diet Mediterranean diet, which has been very helpful for her.  She is routinely going to bed between 10 PM and 12 AM.  She reports some concern regarding her Lamictal and Lexapro, and she is currently working to  titrate down off of her Lexapro given concerns regarding her serotonin levels.  She reports that she is monitoring her trigeminal pain during this time.  She reports balance issues, as well as right leg issues.  She states that she will take or be unbalanced more frequently.  No presyncope or syncope.  No racing heart rate or palpitations.  She reports nausea 1 hour after taking her medications, as well as a weird feeling in the back of her throat.  She reports headache.  She reports fatigue, though she notes that this has been her entire life.  She reports decreased motivation.  At home, SBP at its highest is 140s.  She feels well hydrated most of the time.  She reports medication compliance.  No signs or symptoms of bleeding.  Home Medications    Prior to Admission medications   Medication Sig Start Date End Date Taking? Authorizing Provider  acetaminophen (TYLENOL) 500 MG tablet Take 500 mg by mouth every 6 (six) hours as needed.  [provider]  alendronate (FOSAMAX) 70 MG tablet Take 70 mg by mouth once a week. Friday 10/27/15   [provider]  aspirin EC 81 MG EC tablet Take 1 tablet (81 mg total) by mouth daily. 11/14/18   Gladstone Lighter, MD  Biotin 5 MG TBDP Take 1 tablet by mouth daily.     [provider]  cholecalciferol (VITAMIN D) 25 MCG (1000 UT) tablet Take 5,000 Units by mouth daily.     [provider]  Cyanocobalamin (VITAMIN B-12) 1000 MCG SUBL Take 1 tablet by mouth daily.     [provider]  escitalopram (LEXAPRO) 10 MG tablet Take 5 mg by mouth daily. 1/2 tablet = 59m 07/31/19   [provider]  gabapentin (NEURONTIN) 100 MG capsule Take 100 mg by mouth daily as needed.  02/08/19 02/08/20  [provider]  Glucosamine-Chondroit-Vit C-Mn (GLUCOSAMINE CHONDR 1500 COMPLX) CAPS Take 2 capsules by mouth daily.     [provider]  lamoTRIgine (LAMICTAL) 100 MG tablet Take 125-150 mg by mouth 2 (two) times  daily. 125 mg QAM, 150 QPM 11/21/16   [provider]  lamoTRIgine (LAMICTAL) 25 MG tablet Take 25 mg by mouth daily. 07/20/19   [provider]  LORazepam (ATIVAN) 0.5 MG tablet Take 0.5 mg by mouth daily.    [provider]  losartan (COZAAR) 50 MG tablet Take 1 tablet (50 mg total) by mouth daily. 07/01/19   End, CHarrell Gave MD  losartan (COZAAR) 50 MG tablet Take 1 tablet by mouth daily. 07/01/19   [provider]  Lutein 40 MG CAPS Take 1 capsule by mouth daily.     [provider]  metoprolol succinate (TOPROL-XL) 100 MG 24 hr tablet Take 1 tablet (100 mg total) by mouth daily. Take with or immediately following a meal as directed 07/15/19 10/13/19  DRise Mu PA-C  Multiple Vitamin (MULTIVITAMIN) capsule Take 1 capsule by mouth daily.    [provider]  nitroGLYCERIN (NITROSTAT) 0.4 MG SL tablet Place 1 tablet (0.4 mg total) under the tongue every 5 (five) minutes as needed for chest pain. 11/13/18   KGladstone Lighter MD  NON FORMULARY Take 700 mg by mouth daily. Garlic and ginger    [provider]  psyllium (REGULOID) 0.52 g capsule Take 2,625 mg by mouth daily.    [provider]  pyridOXINE (VITAMIN B-6) 50 MG tablet Take 50 mg by mouth daily.    [provider]  rosuvastatin (CRESTOR) 40 MG tablet Take 1 tablet (40 mg total) by mouth daily. 04/08/19   End, CHarrell Gave MD  vitamin C (ASCORBIC ACID) 500 MG tablet Take 500 mg by mouth daily.     [provider]    Review of Systems    She denies recent chest pain, palpitations, dyspnea, pnd, orthopnea, n, v, dizziness, syncope, edema, weight gain, or early satiety.  She reports leg pain that occurs mostly in the right leg and improves with activity, suspicious for MSK etiology. She reports nausea 1 hour after taking her medications. She reports problems with balance and frequently feels unsteady.  She reports a headache.  She reports fatigue and lack of  motivation.  All other systems reviewed and are otherwise negative except as noted above.  Physical Exam    VS:  BP 130/84 (BP Location: Left Arm, Patient Position: Sitting, Cuff Size: Normal)   Pulse 77   Ht 5' 2"  (1.575 m)   Wt 130 lb (59 kg)  SpO2 97%   BMI 23.78 kg/m  , BMI Body mass index is 23.78 kg/m. GEN: Well nourished, well developed, in no acute distress. Mask in place. HEENT: normal. Neck: Supple, no JVD, carotid bruits, or masses. Cardiac: RRR, 1/6 systolic murmur, rubs, or gallops. No clubbing, cyanosis, edema.  Radials/DP/PT 2+ and equal bilaterally - easily found on exam. Respiratory:  Respirations regular and unlabored, clear to auscultation bilaterally. GI: Soft, nontender, nondistended, BS + x 4. No abdominal bruit. MS: no deformity or atrophy. Skin: warm and dry, no rash. Neuro:  Strength and sensation are intact. Psych: Normal affect.   Accessory Clinical Findings    ECG personally reviewed by me today -NSR, LAD, 77bpm, poor R wave progression is seen in previous EKGs in 3, aVF, V1, PR interval 182 ms, QRS 96 ms, QTC 420 ms-no acute changes from previous.  VITALS Reviewed today   Temp Readings from Last 3 Encounters:  03/25/20 98.4 F (36.9 C)  08/01/19 98.6 F (37 C)  07/15/19 98.1 F (36.7 C)   BP Readings from Last 3 Encounters:  08/21/20 130/84  03/25/20 (!) 146/84  02/12/20 (!) 150/90   Pulse Readings from Last 3 Encounters:  08/21/20 77  03/25/20 75  02/12/20 74    Wt Readings from Last 3 Encounters:  08/21/20 130 lb (59 kg)  03/25/20 129 lb (58.5 kg)  02/12/20 130 lb (59 kg)     LABS  reviewed today     Lab Results  Component Value Date   WBC 7.1 11/13/2018   HGB 13.3 11/13/2018   HCT 41.5 11/13/2018   MCV 96.1 11/13/2018   PLT 243 11/13/2018   Lab Results  Component Value Date   CREATININE 0.76 06/26/2019   BUN 19 06/26/2019   NA 138 06/26/2019   K 4.5 06/26/2019   CL 100 06/26/2019   CO2 28 06/26/2019   CE -  Sodium 140, potassium 4.6, creatinine 0.8, BUN 20   Lab Results  Component Value Date   ALT 28 01/31/2019   AST 31 01/31/2019   ALKPHOS 49 01/31/2019   BILITOT 0.6 01/31/2019   CE-AST 34, ALT 31, alk phos 44   Lab Results  Component Value Date   CHOL 164 01/31/2019   HDL 82 01/31/2019   LDLCALC 66 01/31/2019   TRIG 78 01/31/2019   CHOLHDL 2.0 01/31/2019   CE 03/2019 (??fasting): Total cholesterol 196, triglycerides 94, HDL 89.5, LDL calculated 88  No results found for: HGBA1C No results found for: TSH  TSH 3.554  STUDIES/PROCEDURES reviewed today   Echo 02/2020 1. Left ventricular ejection fraction, by estimation, is 60 to 65%. The  left ventricle has normal function. The left ventricle has no regional  wall motion abnormalities. Left ventricular diastolic parameters are  consistent with Grade I diastolic  dysfunction (impaired relaxation).  2. Right ventricular systolic function is normal. The right ventricular  size is normal. There is normal pulmonary artery systolic pressure.  3. Left atrial size was mildly dilated.  4. The mitral valve is normal in structure. Mild to moderate mitral valve  regurgitation.  5. The aortic valve has an indeterminant number of cusps. Aortic valve  regurgitation is mild. Mild aortic valve stenosis.  6. The inferior vena cava is normal in size with greater than 50%  respiratory variability, suggesting right atrial pressure of 3 mmHg.   Renal artery study 07/2019 Largest Aortic Diameter: 2.2 cm  Renal:  Right: Normal size right kidney. Normal right Resisitive Index.  Normal cortical thickness of right kidney. No evidence of     right renal artery stenosis. RRV flow present.  Left: No evidence of left renal artery stenosis. Normal size of     left kidney. Normal left Resistive Index. LRV flow present.     Normal cortical thickness of the left kidney.  Mesenteric:  Normal Superior Mesenteric artery findings.  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  1. Mild to moderate, non-obstructive coronary artery disease. 2. Normal left ventricular systolic function and filling pressure. Recommendations: 1. Continue current medical therapy and risk factor modification. Follow-up in the office in ~2 weeks.  10/2018 MPI  Abnormal, potentially high risk pharmacologic myocardial perfusion stress test.  There is a moderate in size, mild in severity, reversible defect involving the mid and apical anterior/anterolateral segments concerning for ischemia though artifact cannot be excluded (breast attenuation and misregistration).  Left ventricular systolic function is normal (LVEF > 65%).  Borderline transient ischemic dilation was noted, which is nonspecific but can be seen with balanced ischemia.  Attenuation correction CT demonstrates coronary artery and aortic atherosclerotic calcification.  Assessment & Plan    HTN -BP borderline at 130/84.  Given her report of unsteadiness, will continue her current medications.  If BP still sub-optimal at RTC, reassess changes to current regimen. For now, continue losartan 50 mg daily andToprol XL 100 mg daily.  Continue to recommend total daily fluids under 2 L and total sodium monitor 2 g/day.  Nonobstructive CAD -No signs or symptoms concerning for angina.  Nonobstructive CAD from 10/2018 LHC as outlined above.  Most recent echo with normal EF, perform due to systolic murmur, also appreciated on exam today.  Continue risk factor modification and current medical therapy including ASA, metoprolol, and Crestor.  Paroxysmal SVT --Asymptomatic.  No reported tachypalpitations.  Continue Toprol-XL.    HLD --Continue current statin.  Continue annual monitoring of lipids and LFTs per PCP.  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  Normal superior mesenteric artery findings with 70 to 99% stenosis in the celiac artery. Conservative treatment recommended, especially given her lack of postprandial pain / abdominal discomfort. No abdominal bruit appreciated on exam today. Continue risk factor modification with ASA and statin. Continue to recommend BP, HR, cholesterol control.    Lower extremity pain, R>L, suspect MSK etiology Unsteadiness of gait --Reports lower extremity pain that is worse on her right side and improves with stretching activity.  No reported color changes, temperature changes, numbness or significant loss of sensation.  No noted loss of blood flow with physical exam today significant for 2+ pulses. Not consistent with PAD etiology. Suspicion is for MSK etiology (consider sciatica) given symptoms reported and improvement with stretching and activity.  Continue to monitor, especially given unsteadiness. Contact the office if any change or additional sx.   GERD   --Recommend taking medications after eating.  She will try eating with food / yogurt let us know if her reflux after taking her medications does not resolve.  She reports this reflux is only associated with taking her medications. Could consider PPI /  Imdur at RTC if ongoing sx - defer to RTC.   Disposition: RTC in 4-6 months or sooner if needed.   Arvil Chaco, PA-C 08/21/2020

## 2020-11-10 ENCOUNTER — Other Ambulatory Visit: Payer: Self-pay | Admitting: *Deleted

## 2020-11-10 MED ORDER — ROSUVASTATIN CALCIUM 40 MG PO TABS: 40.0000 mg | ORAL_TABLET | Freq: Every day | ORAL | 3 refills | Status: DC

## 2021-01-14 ENCOUNTER — Other Ambulatory Visit: Payer: Self-pay

## 2021-01-14 MED ORDER — LOSARTAN POTASSIUM 50 MG PO TABS: 50.0000 mg | ORAL_TABLET | Freq: Every day | ORAL | 1 refills | Status: DC

## 2021-01-14 MED ORDER — METOPROLOL SUCCINATE ER 100 MG PO TB24: ORAL_TABLET | ORAL | 1 refills | Status: DC

## 2021-01-27 ENCOUNTER — Encounter: Payer: Self-pay | Admitting: Internal Medicine

## 2021-01-27 ENCOUNTER — Ambulatory Visit: Payer: Medicare Other | Admitting: Internal Medicine

## 2021-01-27 ENCOUNTER — Other Ambulatory Visit: Payer: Self-pay

## 2021-01-27 VITALS — BP 140/80 | HR 68 | Ht 62.0 in | Wt 131.0 lb

## 2021-01-27 DIAGNOSIS — I471 Supraventricular tachycardia: Secondary | ICD-10-CM

## 2021-01-27 DIAGNOSIS — I2510 Atherosclerotic heart disease of native coronary artery without angina pectoris: Secondary | ICD-10-CM

## 2021-01-27 DIAGNOSIS — I774 Celiac artery compression syndrome: Secondary | ICD-10-CM | POA: Diagnosis not present

## 2021-01-27 DIAGNOSIS — I10 Essential (primary) hypertension: Secondary | ICD-10-CM

## 2021-01-27 DIAGNOSIS — I771 Stricture of artery: Secondary | ICD-10-CM | POA: Insufficient documentation

## 2021-01-27 DIAGNOSIS — E785 Hyperlipidemia, unspecified: Secondary | ICD-10-CM | POA: Insufficient documentation

## 2021-01-27 MED ORDER — NITROGLYCERIN 0.4 MG SL SUBL: 0.4000 mg | SUBLINGUAL_TABLET | SUBLINGUAL | 99 refills | Status: AC | PRN

## 2021-01-27 NOTE — Progress Notes (Signed)
Follow-up Outpatient Visit Date: 01/27/2021  Primary Care Provider: Marinda Elk, MD [address] [place] [address]  Chief Complaint: Follow-up coronary artery disease and SVT  HPI:  Kristin Davies is a 75 y.o. female with history of nonobstructive coronary artery disease, PSVT, hyperlipidemia, meningioma, DCIS of the breast, and trigeminal neuralgia, who presents for follow-up of coronary artery to disease, PSVT, and hypertension.  She was last seen in our office in April by Marrianne Mood, PA, at which time she was doing well.  No medication changes or additional testing were pursued.  Today, Kristin Davies reports that she is doing well.  She had experienced mild edema around the time of her last visit, which resolved after discontinuing Lexapro.  She also noted some nausea, which has gone away since taking her medications later in the morning.  She denies shortness of breath, palpitations, and lightheadedness.  She notes a single episode of vague chest discomfort about 2 months ago that resolved with sublingual nitroglycerin x1, though she is not even sure that her nitroglycerin was still good.  She does not recall what she was doing but denies exertional chest pain.  Discomfort has not recurred.  Home blood pressures have been well controlled.  --------------------------------------------------------------------------------------------------  Past Medical History:  Diagnosis Date   Angina at rest [facility])    Arthritis    Breast cancer ([place]) 2016   DCIS, ER negative, PR negative, high-grade. 3.5 cm. 1 mm deep margin (pectoralis fascia). MammoSite radiation.   Cancer ([place]) 03/26/2015   DCIS, ER negative, PR negative, high-grade. 3.5 cm. 1 mm deep margin (pectoralis fascia).   Osteoarthritis    Osteoporosis    Personal history of radiation therapy 2016   BREAST CA   PONV (postoperative nausea and vomiting)    SVT (supraventricular tachycardia) ([place])  2012   Trigeminal neuralgia    Past Surgical History:  Procedure Laterality Date   BREAST BIOPSY Right 1985   Negative   BREAST BIOPSY Right 03/02/2015   DCIS   BREAST CYST ASPIRATION Right 1985   Negative   BREAST FIBROADENOMA SURGERY Right 1982   [place]    BREAST LUMPECTOMY Right 03/26/2015   Procedure: BREAST LUMPECTOMY;  Surgeon: Robert Bellow, MD;  Location: [facility] ORS;  Service: General;  Laterality: Right;   BREAST MAMMOSITE Right 04/28/2015   BUNIONECTOMY WITH HAMMERTOE RECONSTRUCTION Left 2012   CARDIAC CATHETERIZATION     COLONOSCOPY  2013   Dr Vira Agar   CRANIOPLASTY  2014   [place]  2013   LEFT HEART CATH AND CORONARY ANGIOGRAPHY N/A 11/16/2018   Procedure: LEFT HEART CATH AND CORONARY ANGIOGRAPHY;  Surgeon: Nelva Bush, MD;  Location: [place] CV LAB;  Service: Cardiovascular;  Laterality: N/A;   VAGINAL HYSTERECTOMY  1978    Current Meds  Medication Sig   acetaminophen (TYLENOL) 500 MG tablet Take 500 mg by mouth every 6 (six) hours as needed.   alendronate (FOSAMAX) 70 MG tablet Take 70 mg by mouth once a week. Friday   aspirin EC 81 MG EC tablet Take 1 tablet (81 mg total) by mouth daily.   baclofen (LIORESAL) 10 MG tablet Take 5 mg by mouth at bedtime.   Biotin 5 MG TBDP Take 1 tablet by mouth daily.    cholecalciferol (VITAMIN D) 25 MCG (1000 UT) tablet Take 5,000 Units by mouth daily.    Cyanocobalamin (VITAMIN B-12) 1000 MCG SUBL Take 1 tablet by mouth daily.  Glucosamine-Chondroit-Vit C-Mn (GLUCOSAMINE CHONDR 1500 COMPLX) CAPS Take 2 capsules by mouth daily.    lamoTRIgine (LAMICTAL) 100 MG tablet Take 125-150 mg by mouth 2 (two) times daily. 125 mg QAM, 150 QPM   lamoTRIgine (LAMICTAL) 25 MG tablet Take 1 tablet in the morning and 2 tablets in the evening   losartan (COZAAR) 50 MG tablet Take 1 tablet (50 mg total) by mouth daily.   Lutein 40 MG CAPS Take 1 capsule by mouth daily.    metoprolol succinate (TOPROL-XL) 100  MG 24 hr tablet TAKE 1 TABLET BY MOUTH  DAILY TAKE WITH OR  IMMEDIATELY FOLLOWING A  MEAL AS DIRECTED   Multiple Vitamin (MULTIVITAMIN) capsule Take 1 capsule by mouth daily.   nitroGLYCERIN (NITROSTAT) 0.4 MG SL tablet Place 1 tablet (0.4 mg total) under the tongue every 5 (five) minutes as needed for chest pain.   NON FORMULARY Take 700 mg by mouth daily. Garlic and ginger   psyllium (REGULOID) 0.52 g capsule Take 2,625 mg by mouth daily.   pyridOXINE (VITAMIN B-6) 50 MG tablet Take 50 mg by mouth daily.   rosuvastatin (CRESTOR) 40 MG tablet Take 1 tablet (40 mg total) by mouth daily.   vitamin C (ASCORBIC ACID) 500 MG tablet Take 500 mg by mouth daily.    XIIDRA 5 % SOLN Apply 1 drop to eye at bedtime. To both eyes    Allergies: Patient has no known allergies.  Social History   Tobacco Use   Smoking status: Never   Smokeless tobacco: Never  Vaping Use   Vaping Use: Never used  Substance Use Topics   Alcohol use: Yes    Alcohol/week: 5.0 standard drinks    Types: 5 Glasses of wine per week    Comment: 5 oz of wine 5 days per week   Drug use: No    Family History  Problem Relation Age of Onset   Heart disease Father        died of heart attack and stroke at 46   Alzheimer's disease Mother    Breast cancer Paternal Aunt        mid 53's   Heart disease Maternal Grandfather    Heart disease Paternal Grandmother     Review of Systems: A 12-system review of systems was performed and was negative except as noted in the HPI.  --------------------------------------------------------------------------------------------------  Physical Exam: BP 140/80 (BP Location: Left Arm, Patient Position: Sitting, Cuff Size: Normal)   Pulse 68   Ht '5\' 2"'$  (1.575 m)   Wt 131 lb (59.4 kg)   SpO2 97%   BMI 23.96 kg/m   General:  NAD. Neck: No JVD or HJR. Lungs: Clear to auscultation bilaterally without wheezes or crackles. Heart: Regular rate and rhythm without murmurs, rubs, or  gallops. Abdomen: Soft, nontender, nondistended. Extremities: No lower extremity edema.  EKG: Normal sinus rhythm with left axis deviation and borderline low voltage.  No significant change since 08/21/2020.  Lab Results  Component Value Date   WBC 7.1 11/13/2018   HGB 13.3 11/13/2018   HCT 41.5 11/13/2018   MCV 96.1 11/13/2018   PLT 243 11/13/2018    Lab Results  Component Value Date   NA 138 06/26/2019   K 4.5 06/26/2019   CL 100 06/26/2019   CO2 28 06/26/2019   BUN 19 06/26/2019   CREATININE 0.76 06/26/2019   GLUCOSE 138 (H) 06/26/2019   ALT 28 01/31/2019    Lab Results  Component Value Date   CHOL  164 01/31/2019   HDL 82 01/31/2019   LDLCALC 66 01/31/2019   TRIG 78 01/31/2019   CHOLHDL 2.0 01/31/2019    --------------------------------------------------------------------------------------------------  ASSESSMENT AND PLAN: Nonobstructive coronary artery disease: Overall, Kristin Davies has been doing well but notes a single episode of vague chest discomfort about 2 months ago.  It resolved with sublingual nitroglycerin, though she is unsure if this actually helped or if it was a placebo effect.  She does not have any exertional symptoms.  We will continue current regimen of aspirin, metoprolol, and rosuvastatin unless she has worsening discomfort.  PSVT: No further symptoms reported.  Continue metoprolol.  Hypertension: Blood pressure borderline elevated today but typically better at home.  Defer med medication changes at this time.  Celiac artery stenosis: Kristin Davies was incidentally noted to have celiac artery stenosis on prior Doppler examination.  She has not experienced any symptoms.  Continue aspirin and statin therapy.  Hyperlipidemia: Lipids well controlled on last check through PCP in 04/2020.  Continue rosuvastatin with repeat labs scheduled at annual visit with Dr. Carrie Mew this fall.  Follow-up: Return to clinic in 1 year.  Nelva Bush,  MD 01/27/2021 9:24 AM

## 2021-01-27 NOTE — Patient Instructions (Signed)
Medication Instructions:   Your physician recommends that you continue on your current medications as directed. Please refer to the Current Medication list given to you today.  *If you need a refill on your cardiac medications before your next appointment, please call your pharmacy*   Lab Work:  None ordered  Testing/Procedures:  None ordered   Follow-Up: At [facility], you and your health needs are our priority.  As part of our continuing mission to provide you with exceptional heart care, we have created designated Provider Care Teams.  These Care Teams include your primary Cardiologist (physician) and Advanced Practice Providers (APPs -  Physician Assistants and Nurse Practitioners) who all work together to provide you with the care you need, when you need it.  We recommend signing up for the patient portal called "MyChart".  Sign up information is provided on this After Visit Summary.  MyChart is used to connect with patients for Virtual Visits (Telemedicine).  Patients are able to view lab/test results, encounter notes, upcoming appointments, etc.  Non-urgent messages can be sent to your provider as well.   To learn more about what you can do with MyChart, go to [email or web address].    Your next appointment:   1 year(s)  The format for your next appointment:   In Person  Provider:   You may see Christopher End, MD or one of the following Advanced Practice Providers on your designated Care Team:   Christopher Berge, NP Ryan Dunn, PA-C Jacquelyn Visser, PA-C Cadence Furth, PA-C  

## 2021-03-04 ENCOUNTER — Other Ambulatory Visit: Payer: Self-pay | Admitting: Physician Assistant

## 2021-03-04 DIAGNOSIS — Z1231 Encounter for screening mammogram for malignant neoplasm of breast: Secondary | ICD-10-CM

## 2021-03-23 ENCOUNTER — Ambulatory Visit
Admission: RE | Admit: 2021-03-23 | Discharge: 2021-03-23 | Disposition: A | Discharge status: Home / Self Care | Payer: Medicare Other | Source: Ambulatory Visit | Attending: Physician Assistant | Admitting: Physician Assistant

## 2021-03-23 ENCOUNTER — Other Ambulatory Visit: Payer: Self-pay

## 2021-03-23 DIAGNOSIS — Z1231 Encounter for screening mammogram for malignant neoplasm of breast: Secondary | ICD-10-CM | POA: Diagnosis not present

## 2021-06-29 ENCOUNTER — Other Ambulatory Visit: Payer: Self-pay | Admitting: Internal Medicine

## 2021-07-06 ENCOUNTER — Other Ambulatory Visit: Payer: Self-pay | Admitting: Internal Medicine

## 2021-09-03 ENCOUNTER — Encounter: Payer: Self-pay | Admitting: Internal Medicine

## 2021-09-14 ENCOUNTER — Encounter: Payer: Self-pay | Admitting: Internal Medicine

## 2021-09-14 MED ORDER — ROSUVASTATIN CALCIUM 40 MG PO TABS: 40.0000 mg | ORAL_TABLET | Freq: Every day | ORAL | 2 refills | Status: DC

## 2021-09-14 MED ORDER — METOPROLOL SUCCINATE ER 100 MG PO TB24: ORAL_TABLET | ORAL | 2 refills | Status: DC

## 2021-11-10 ENCOUNTER — Other Ambulatory Visit: Payer: Self-pay

## 2021-11-10 MED ORDER — METOPROLOL SUCCINATE ER 100 MG PO TB24: 100.0000 mg | ORAL_TABLET | Freq: Every day | ORAL | 2 refills | Status: DC

## 2021-12-02 ENCOUNTER — Encounter: Payer: Self-pay | Admitting: Ophthalmology

## 2021-12-06 NOTE — Discharge Instructions (Signed)

## 2021-12-08 ENCOUNTER — Ambulatory Visit
Admission: RE | Admit: 2021-12-08 | Discharge: 2021-12-08 | Disposition: A | Discharge status: Home / Self Care | Payer: Medicare Other | Attending: Ophthalmology | Admitting: Ophthalmology

## 2021-12-08 ENCOUNTER — Ambulatory Visit: Payer: Medicare Other | Admitting: Anesthesiology

## 2021-12-08 ENCOUNTER — Other Ambulatory Visit: Payer: Self-pay

## 2021-12-08 ENCOUNTER — Encounter: Payer: Self-pay | Admitting: Ophthalmology

## 2021-12-08 ENCOUNTER — Encounter
Admission: RE | Disposition: A | Discharge status: Home / Self Care | Payer: Self-pay | Source: Home / Self Care | Attending: Ophthalmology

## 2021-12-08 DIAGNOSIS — H2511 Age-related nuclear cataract, right eye: Secondary | ICD-10-CM | POA: Diagnosis present

## 2021-12-08 DIAGNOSIS — I2510 Atherosclerotic heart disease of native coronary artery without angina pectoris: Secondary | ICD-10-CM

## 2021-12-08 HISTORY — DX: Atherosclerotic heart disease of native coronary artery without angina pectoris: I25.10

## 2021-12-08 HISTORY — DX: Presence of external hearing-aid: Z97.4

## 2021-12-08 HISTORY — PX: CATARACT EXTRACTION W/PHACO: SHX586

## 2021-12-08 HISTORY — DX: Essential (primary) hypertension: I10

## 2021-12-08 SURGERY — PHACOEMULSIFICATION, CATARACT, WITH IOL INSERTION
Anesthesia: Monitor Anesthesia Care | Site: Eye | Laterality: Right

## 2021-12-08 MED ORDER — TETRACAINE HCL 0.5 % OP SOLN
1.0000 [drp] | OPHTHALMIC | Status: DC | PRN
  Administered 2021-12-08 (×3): 1 [drp] via OPHTHALMIC

## 2021-12-08 MED ORDER — FENTANYL CITRATE (PF) 100 MCG/2ML IJ SOLN
INTRAMUSCULAR | Status: DC | PRN
  Administered 2021-12-08: 50 ug via INTRAVENOUS

## 2021-12-08 MED ORDER — ARMC OPHTHALMIC DILATING DROPS
1.0000 | OPHTHALMIC | Status: DC | PRN
  Administered 2021-12-08 (×3): 1 via OPHTHALMIC

## 2021-12-08 MED ORDER — SIGHTPATH DOSE#1 BSS IO SOLN
INTRAOCULAR | Status: DC | PRN
  Administered 2021-12-08: 15 mL

## 2021-12-08 MED ORDER — SIGHTPATH DOSE#1 BSS IO SOLN
INTRAOCULAR | Status: DC | PRN
  Administered 2021-12-08: 70 mL via OPHTHALMIC

## 2021-12-08 MED ORDER — SIGHTPATH DOSE#1 NA HYALUR & NA CHOND-NA HYALUR IO KIT
PACK | INTRAOCULAR | Status: DC | PRN
  Administered 2021-12-08: 1 via OPHTHALMIC

## 2021-12-08 MED ORDER — BRIMONIDINE TARTRATE-TIMOLOL 0.2-0.5 % OP SOLN
OPHTHALMIC | Status: DC | PRN
  Administered 2021-12-08: 1 [drp] via OPHTHALMIC

## 2021-12-08 MED ORDER — MIDAZOLAM HCL 2 MG/2ML IJ SOLN
INTRAMUSCULAR | Status: DC | PRN
  Administered 2021-12-08: 1 mg via INTRAVENOUS

## 2021-12-08 MED ORDER — CEFUROXIME OPHTHALMIC INJECTION 1 MG/0.1 ML
INJECTION | OPHTHALMIC | Status: DC | PRN
  Administered 2021-12-08: 1 mg via OPHTHALMIC

## 2021-12-08 MED ORDER — SIGHTPATH DOSE#1 BSS IO SOLN
INTRAOCULAR | Status: DC | PRN
  Administered 2021-12-08: 1 mL via INTRAMUSCULAR

## 2021-12-08 SURGICAL SUPPLY — 11 items
CATARACT SUITE SIGHTPATH (MISCELLANEOUS) ×2 IMPLANT
FEE CATARACT SUITE SIGHTPATH (MISCELLANEOUS) ×1 IMPLANT
GLOVE SRG 8 PF TXTR STRL LF DI (GLOVE) ×1 IMPLANT
GLOVE SURG ENC TEXT LTX SZ7.5 (GLOVE) ×2 IMPLANT
GLOVE SURG UNDER POLY LF SZ8 (GLOVE) ×2
LENS IOL TECNIS EYHANCE 19.0 (Intraocular Lens) ×1 IMPLANT
NDL FILTER BLUNT 18X1 1/2 (NEEDLE) ×1 IMPLANT
NEEDLE FILTER BLUNT 18X 1/2SAF (NEEDLE) ×1
NEEDLE FILTER BLUNT 18X1 1/2 (NEEDLE) ×1 IMPLANT
SYR 3ML LL SCALE MARK (SYRINGE) ×2 IMPLANT
WATER STERILE IRR 250ML POUR (IV SOLUTION) ×2 IMPLANT

## 2021-12-08 NOTE — Anesthesia Postprocedure Evaluation (Signed)
Anesthesia Post Note  Patient: Kristin Davies  Procedure(s) Performed: CATARACT EXTRACTION PHACO AND INTRAOCULAR LENS PLACEMENT (IOC) RIGHT (Right: Eye)     Patient location during evaluation: PACU Anesthesia Type: MAC Level of consciousness: awake and alert Pain management: pain level controlled Vital Signs Assessment: post-procedure vital signs reviewed and stable Respiratory status: nonlabored ventilation and spontaneous breathing Cardiovascular status: blood pressure returned to baseline Postop Assessment: no apparent nausea or vomiting Anesthetic complications: no   No notable events documented.  Faustino Luecke [employer]

## 2021-12-08 NOTE — Transfer of Care (Signed)
Immediate Anesthesia Transfer of Care Note  Patient: Kristin Davies  Procedure(s) Performed: CATARACT EXTRACTION PHACO AND INTRAOCULAR LENS PLACEMENT (IOC) RIGHT (Right: Eye)  Patient Location: PACU  Anesthesia Type: MAC  Level of Consciousness: awake, alert  and patient cooperative  Airway and Oxygen Therapy: Patient Spontanous Breathing and Patient connected to supplemental oxygen  Post-op Assessment: Post-op Vital signs reviewed, Patient's Cardiovascular Status Stable, Respiratory Function Stable, Patent Airway and No signs of Nausea or vomiting  Post-op Vital Signs: Reviewed and stable  Complications: No notable events documented.

## 2021-12-08 NOTE — Op Note (Signed)
LOCATION:  [place]   PREOPERATIVE DIAGNOSIS:    Nuclear sclerotic cataract right eye. H25.11   POSTOPERATIVE DIAGNOSIS:  Nuclear sclerotic cataract right eye.     PROCEDURE:  Phacoemusification with posterior chamber intraocular lens placement of the right eye   ULTRASOUND TIME: Procedure(s) with comments: CATARACT EXTRACTION PHACO AND INTRAOCULAR LENS PLACEMENT (IOC) RIGHT (Right) - 13.96 01:21.9   LENS:   Implant Name Type Inv. Item Serial No. Manufacturer Lot No. LRB No. Used Action  LENS IOL TECNIS EYHANCE 19.0 - [id number] Intraocular Lens LENS IOL TECNIS EYHANCE 19.0 [id number] [employer]  Right 1 Implanted         SURGEON:  Wyonia Hough, MD   ANESTHESIA:  Topical with tetracaine drops and 2% Xylocaine jelly, augmented with 1% preservative-free intracameral lidocaine.    COMPLICATIONS:  None.   DESCRIPTION OF PROCEDURE:  The patient was identified in the holding room and transported to the operating room and placed in the supine position under the operating microscope.  The right eye was identified as the operative eye and it was prepped and draped in the usual sterile ophthalmic fashion.   A 1 millimeter clear-corneal paracentesis was made at the 12:00 position.  0.5 ml of preservative-free 1% lidocaine was injected into the anterior chamber. The anterior chamber was filled with Viscoat viscoelastic.  A 2.4 millimeter keratome was used to make a near-clear corneal incision at the 9:00 position.  A curvilinear capsulorrhexis was made with a cystotome and capsulorrhexis forceps.  Balanced salt solution was used to hydrodissect and hydrodelineate the nucleus.   Phacoemulsification was then used in stop and chop fashion to remove the lens nucleus and epinucleus.  The remaining cortex was then removed using the irrigation and aspiration handpiece. Provisc was then placed into the capsular bag to distend it for lens placement.  A lens was then injected into the  capsular bag.  The remaining viscoelastic was aspirated.   Wounds were hydrated with balanced salt solution.  The anterior chamber was inflated to a physiologic pressure with balanced salt solution.  No wound leaks were noted. Cefuroxime 0.1 ml of a '10mg'$ /ml solution was injected into the anterior chamber for a dose of 1 mg of intracameral antibiotic at the completion of the case.   Timolol and Brimonidine drops were applied to the eye.  The patient was taken to the recovery room in stable condition without complications of anesthesia or surgery.   Ronnett Pullin 12/08/2021, 11:44 AM

## 2021-12-08 NOTE — Anesthesia Preprocedure Evaluation (Signed)
Anesthesia Evaluation  Patient identified by MRN, date of birth, ID band Patient awake    Reviewed: Allergy & Precautions, NPO status , Patient's Chart, lab work & pertinent test results, reviewed documented beta blocker date and time   History of Anesthesia Complications (+) PONV and history of anesthetic complications  Airway Mallampati: II  TM Distance: >3 FB Neck ROM: Full    Dental no notable dental hx.    Pulmonary neg pulmonary ROS,    Pulmonary exam normal        Cardiovascular hypertension, Normal cardiovascular exam+ dysrhythmias Supra Ventricular Tachycardia      Neuro/Psych H/o meningioma  Neuromuscular disease (Trigeminal neuralgia) negative psych ROS   GI/Hepatic negative GI ROS, Neg liver ROS,   Endo/Other  negative endocrine ROS  Renal/GU      Musculoskeletal  (+) Arthritis ,   Abdominal Normal abdominal exam  (+)   Peds  Hematology negative hematology ROS (+)   Anesthesia Other Findings H/o breast cancer  Reproductive/Obstetrics                             Anesthesia Physical Anesthesia Plan  ASA: 3  Anesthesia Plan: MAC   Post-op Pain Management: Minimal or no pain anticipated   Induction: Intravenous  PONV Risk Score and Plan: 3 and TIVA, Midazolam and Treatment may vary due to age or medical condition  Airway Management Planned: Nasal Cannula and Natural Airway  Additional Equipment:   Intra-op Plan:   Post-operative Plan:   Informed Consent: I have reviewed the patients History and Physical, chart, labs and discussed the procedure including the risks, benefits and alternatives for the proposed anesthesia with the patient or authorized representative who has indicated his/her understanding and acceptance.     Dental advisory given  Plan Discussed with: CRNA  Anesthesia Plan Comments:         Anesthesia Quick Evaluation

## 2021-12-08 NOTE — H&P (Signed)
[facility]   Primary Care Physician:  Marinda Elk, MD Ophthalmologist: Dr. Leandrew Koyanagi  Pre-Procedure History & Physical: HPI:  Kristin Davies is a 76 y.o. female here for ophthalmic surgery.   Past Medical History:  Diagnosis Date   Angina at rest [facility])    Arthritis    Breast cancer ([place]) 2016   DCIS, ER negative, PR negative, high-grade. 3.5 cm. 1 mm deep margin (pectoralis fascia). MammoSite radiation.   Cancer ([place]) 03/26/2015   DCIS, ER negative, PR negative, high-grade. 3.5 cm. 1 mm deep margin (pectoralis fascia).   Coronary artery disease    Hypertension    Osteoarthritis    Osteoporosis    Personal history of radiation therapy 2016   BREAST CA   PONV (postoperative nausea and vomiting)    SVT (supraventricular tachycardia) ([place]) 2012   Trigeminal neuralgia    Wears hearing aid in both ears     Past Surgical History:  Procedure Laterality Date   BREAST BIOPSY Right 1985   Negative   BREAST BIOPSY Right 03/02/2015   DCIS   BREAST CYST ASPIRATION Right 1985   Negative   BREAST FIBROADENOMA SURGERY Right 1982   [place]    BREAST LUMPECTOMY Right 03/26/2015   Procedure: BREAST LUMPECTOMY;  Surgeon: Robert Bellow, MD;  Location: [facility] ORS;  Service: General;  Laterality: Right;   BREAST MAMMOSITE Right 04/28/2015   BUNIONECTOMY WITH HAMMERTOE RECONSTRUCTION Left 2012   CARDIAC CATHETERIZATION     COLONOSCOPY  2013   Dr Vira Agar   CRANIOPLASTY  2014   [place]  2013   LEFT HEART CATH AND CORONARY ANGIOGRAPHY N/A 11/16/2018   Procedure: LEFT HEART CATH AND CORONARY ANGIOGRAPHY;  Surgeon: Nelva Bush, MD;  Location: [place] CV LAB;  Service: Cardiovascular;  Laterality: N/A;   VAGINAL HYSTERECTOMY  1978    Prior to Admission medications   Medication Sig Start Date End Date Taking? Authorizing Provider  acetaminophen (TYLENOL) 500 MG tablet Take 500 mg by mouth every 6 (six) hours as needed.   Yes [provider]  alendronate (FOSAMAX) 70 MG tablet Take 70 mg by mouth once a week. Friday 10/27/15  Yes [provider]  aspirin EC 81 MG EC tablet Take 1 tablet (81 mg total) by mouth daily. 11/14/18  Yes Gladstone Lighter, MD  Biotin 5 MG TBDP Take 1 tablet by mouth daily.    Yes [provider]  cholecalciferol (VITAMIN D) 25 MCG (1000 UT) tablet Take 5,000 Units by mouth daily.    Yes [provider]  Cyanocobalamin (VITAMIN B-12) 1000 MCG SUBL Take 1 tablet by mouth daily.    Yes [provider]  Glucosamine-Chondroit-Vit C-Mn (GLUCOSAMINE CHONDR 1500 COMPLX) CAPS Take 2 capsules by mouth daily.    Yes [provider]  lamoTRIgine (LAMICTAL) 100 MG tablet Take 125-150 mg by mouth 2 (two) times daily. 125 mg QAM, 150 QPM 11/21/16  Yes [provider]  lamoTRIgine (LAMICTAL) 25 MG tablet Take 1 tablet in the morning and 2 tablets in the evening   Yes [provider]  losartan (COZAAR) 50 MG tablet TAKE 1 TABLET BY MOUTH  DAILY 06/30/21  Yes End, Harrell Gave, MD  Lutein 40 MG CAPS Take 1 capsule by mouth daily.    Yes [provider]  metoprolol succinate (TOPROL-XL) 100 MG 24 hr tablet Take 1 tablet (100 mg total) by mouth daily. Take with or immediately following a meal. 11/10/21  Yes End,  Harrell Gave, MD  Multiple Vitamin (MULTIVITAMIN) capsule Take 1 capsule by mouth daily.   Yes [provider]  NON FORMULARY Take 700 mg by mouth daily. Garlic and ginger   Yes [provider]  psyllium (REGULOID) 0.52 g capsule Take 2,625 mg by mouth daily.   Yes [provider]  pyridOXINE (VITAMIN B-6) 50 MG tablet Take 50 mg by mouth daily.   Yes [provider]  rosuvastatin (CRESTOR) 40 MG tablet Take 1 tablet (40 mg total) by mouth daily. 09/14/21  Yes End, Harrell Gave, MD  vitamin C (ASCORBIC ACID) 500 MG tablet Take 500 mg by mouth daily.    Yes [provider]  XIIDRA 5 % SOLN Apply 1 drop  to eye at bedtime. To both eyes 11/15/20  Yes [provider]  nitroGLYCERIN (NITROSTAT) 0.4 MG SL tablet Place 1 tablet (0.4 mg total) under the tongue every 5 (five) minutes as needed for chest pain. 01/27/21   End, Harrell Gave, MD    Allergies as of 10/06/2021   (No Known Allergies)    Family History  Problem Relation Age of Onset   Heart disease Father        died of heart attack and stroke at 43   Alzheimer's disease Mother    Breast cancer Paternal Aunt        mid 52's   Heart disease Maternal Grandfather    Heart disease Paternal Grandmother     Social History   Socioeconomic History   Marital status: Married    Spouse name: Joe   Number of children: 3   Years of education: Not on file   Highest education level: Not on file  Occupational History   Not on file  Tobacco Use   Smoking status: Never   Smokeless tobacco: Never  Vaping Use   Vaping Use: Never used  Substance and Sexual Activity   Alcohol use: Yes    Alcohol/week: 7.0 standard drinks of alcohol    Types: 7 Glasses of wine per week    Comment: 5 oz of wine 7 days per week   Drug use: No   Sexual activity: Not on file  Other Topics Concern   Not on file  Social History Narrative   Not on file   Social Determinants of Health   Financial Resource Strain: Low Risk  (11/16/2018)   Overall Financial Resource Strain (CARDIA)    Difficulty of Paying Living Expenses: Not very hard  Food Insecurity: No Food Insecurity (11/16/2018)   Hunger Vital Sign    Worried About Running Out of Food in the Last Year: Never true    Ran Out of Food in the Last Year: Never true  Transportation Needs: No Transportation Needs (11/16/2018)   PRAPARE - Hydrologist (Medical): No    Lack of Transportation (Non-Medical): No  Physical Activity: Sufficiently Active (11/16/2018)   Exercise Vital Sign    Days of Exercise per Week: 4 days    Minutes of Exercise per Session: 40 min  Stress: No  Stress Concern Present (11/16/2018)   [place]    Feeling of Stress : Only a little  Social Connections: Unknown (11/16/2018)   Social Connection and Isolation Panel [NHANES]    Frequency of Communication with Friends and Family: More than three times a week    Frequency of Social Gatherings with Friends and Family: Not on file    Attends Religious Services:  Not on file    Active Member of Clubs or Organizations: Not on file    Attends Club or Organization Meetings: Not on file    Marital Status: Not on file  Intimate Partner Violence: Not At Risk (11/16/2018)   Humiliation, Afraid, Rape, and Kick questionnaire    Fear of Current or Ex-Partner: No    Emotionally Abused: No    Physically Abused: No    Sexually Abused: No    Review of Systems: See HPI, otherwise negative ROS  Physical Exam: BP (!) 170/89   Pulse 83   Temp (!) 97.2 F (36.2 C) (Temporal)   Resp 18   Ht '5\' 1"'$  (1.549 m)   Wt 57.6 kg   SpO2 97%   BMI 24.00 kg/m  General:   Alert,  pleasant and cooperative in NAD Head:  Normocephalic and atraumatic. Lungs:  Clear to auscultation.    Heart:  Regular rate and rhythm. Gr III Murmur  Impression/Plan: Kristin Davies is here for ophthalmic surgery.  Risks, benefits, limitations, and alternatives regarding ophthalmic surgery have been reviewed with the patient.  Questions have been answered.  All parties agreeable.   Leandrew Koyanagi, MD  12/08/2021, 10:48 AM

## 2021-12-09 ENCOUNTER — Encounter: Payer: Self-pay | Admitting: Ophthalmology

## 2021-12-28 NOTE — Discharge Instructions (Signed)

## 2021-12-29 ENCOUNTER — Encounter: Payer: Self-pay | Admitting: Ophthalmology

## 2021-12-29 ENCOUNTER — Ambulatory Visit
Admission: RE | Admit: 2021-12-29 | Discharge: 2021-12-29 | Disposition: A | Discharge status: Home / Self Care | Payer: Medicare Other | Attending: Ophthalmology | Admitting: Ophthalmology

## 2021-12-29 ENCOUNTER — Encounter
Admission: RE | Disposition: A | Discharge status: Home / Self Care | Payer: Self-pay | Source: Home / Self Care | Attending: Ophthalmology

## 2021-12-29 ENCOUNTER — Ambulatory Visit (AMBULATORY_SURGERY_CENTER): Payer: Medicare Other | Admitting: Anesthesiology

## 2021-12-29 ENCOUNTER — Ambulatory Visit: Payer: Medicare Other | Admitting: Anesthesiology

## 2021-12-29 ENCOUNTER — Other Ambulatory Visit: Payer: Self-pay

## 2021-12-29 DIAGNOSIS — I10 Essential (primary) hypertension: Secondary | ICD-10-CM | POA: Insufficient documentation

## 2021-12-29 DIAGNOSIS — M1990 Unspecified osteoarthritis, unspecified site: Secondary | ICD-10-CM | POA: Diagnosis not present

## 2021-12-29 DIAGNOSIS — H2512 Age-related nuclear cataract, left eye: Secondary | ICD-10-CM | POA: Insufficient documentation

## 2021-12-29 DIAGNOSIS — G500 Trigeminal neuralgia: Secondary | ICD-10-CM | POA: Diagnosis not present

## 2021-12-29 DIAGNOSIS — I2510 Atherosclerotic heart disease of native coronary artery without angina pectoris: Secondary | ICD-10-CM

## 2021-12-29 DIAGNOSIS — Z853 Personal history of malignant neoplasm of breast: Secondary | ICD-10-CM | POA: Insufficient documentation

## 2021-12-29 HISTORY — PX: CATARACT EXTRACTION W/PHACO: SHX586

## 2021-12-29 SURGERY — PHACOEMULSIFICATION, CATARACT, WITH IOL INSERTION
Anesthesia: Monitor Anesthesia Care | Site: Eye | Laterality: Left

## 2021-12-29 MED ORDER — ARMC OPHTHALMIC DILATING DROPS
1.0000 | OPHTHALMIC | Status: DC | PRN
  Administered 2021-12-29 (×3): 1 via OPHTHALMIC

## 2021-12-29 MED ORDER — SIGHTPATH DOSE#1 BSS IO SOLN
INTRAOCULAR | Status: DC | PRN
  Administered 2021-12-29: 63 mL via OPHTHALMIC

## 2021-12-29 MED ORDER — MIDAZOLAM HCL 2 MG/2ML IJ SOLN
INTRAMUSCULAR | Status: DC | PRN
  Administered 2021-12-29: 2 mg via INTRAVENOUS

## 2021-12-29 MED ORDER — BRIMONIDINE TARTRATE-TIMOLOL 0.2-0.5 % OP SOLN
OPHTHALMIC | Status: DC | PRN
  Administered 2021-12-29: 1 [drp] via OPHTHALMIC

## 2021-12-29 MED ORDER — SIGHTPATH DOSE#1 BSS IO SOLN
INTRAOCULAR | Status: DC | PRN
  Administered 2021-12-29: 1 mL

## 2021-12-29 MED ORDER — SIGHTPATH DOSE#1 NA HYALUR & NA CHOND-NA HYALUR IO KIT
PACK | INTRAOCULAR | Status: DC | PRN
  Administered 2021-12-29: 1 via OPHTHALMIC

## 2021-12-29 MED ORDER — TETRACAINE HCL 0.5 % OP SOLN
1.0000 [drp] | OPHTHALMIC | Status: DC | PRN
  Administered 2021-12-29 (×3): 1 [drp] via OPHTHALMIC

## 2021-12-29 MED ORDER — SIGHTPATH DOSE#1 BSS IO SOLN
INTRAOCULAR | Status: DC | PRN
  Administered 2021-12-29: 15 mL

## 2021-12-29 MED ORDER — FENTANYL CITRATE (PF) 100 MCG/2ML IJ SOLN
INTRAMUSCULAR | Status: DC | PRN
Start: 2021-12-29 — End: 2021-12-29
  Administered 2021-12-29: 50 ug via INTRAVENOUS

## 2021-12-29 MED ORDER — CEFUROXIME OPHTHALMIC INJECTION 1 MG/0.1 ML
INJECTION | OPHTHALMIC | Status: DC | PRN
  Administered 2021-12-29: 0.1 mL via INTRACAMERAL

## 2021-12-29 SURGICAL SUPPLY — 21 items
CANNULA ANT/CHMB 27G (MISCELLANEOUS) IMPLANT
CANNULA ANT/CHMB 27GA (MISCELLANEOUS) IMPLANT
CATARACT SUITE SIGHTPATH (MISCELLANEOUS) ×2 IMPLANT
FEE CATARACT SUITE SIGHTPATH (MISCELLANEOUS) ×1 IMPLANT
GLOVE SRG 8 PF TXTR STRL LF DI (GLOVE) ×1 IMPLANT
GLOVE SURG ENC TEXT LTX SZ7.5 (GLOVE) ×2 IMPLANT
GLOVE SURG GAMMEX PI TX LF 7.5 (GLOVE) IMPLANT
GLOVE SURG UNDER POLY LF SZ8 (GLOVE) ×2
LENS IOL TECNIS EYHANCE 21.0 (Intraocular Lens) ×1 IMPLANT
NDL FILTER BLUNT 18X1 1/2 (NEEDLE) ×1 IMPLANT
NDL RETROBULBAR .5 NSTRL (NEEDLE) IMPLANT
NEEDLE FILTER BLUNT 18X 1/2SAF (NEEDLE) ×1
NEEDLE FILTER BLUNT 18X1 1/2 (NEEDLE) ×1 IMPLANT
PACK VIT ANT 23G (MISCELLANEOUS) IMPLANT
RING MALYGIN 7.0 (MISCELLANEOUS) IMPLANT
SUT ETHILON 10-0 CS-B-6CS-B-6 (SUTURE)
SUT VICRYL  9 0 (SUTURE)
SUT VICRYL 9 0 (SUTURE) IMPLANT
SUTURE EHLN 10-0 CS-B-6CS-B-6 (SUTURE) IMPLANT
SYR 3ML LL SCALE MARK (SYRINGE) ×2 IMPLANT
WATER STERILE IRR 250ML POUR (IV SOLUTION) ×2 IMPLANT

## 2021-12-29 NOTE — H&P (Signed)
[facility]   Primary Care Physician:  Marinda Elk, MD Ophthalmologist: Dr. Leandrew Koyanagi  Pre-Procedure History & Physical: HPI:  Kristin Davies is a 76 y.o. female here for ophthalmic surgery.   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 3.5 cm. 1 mm deep margin (pectoralis fascia). MammoSite radiation.   Cancer ([place]) 03/26/2015   DCIS, ER negative, PR negative, high-grade. 3.5 cm. 1 mm deep margin (pectoralis fascia).   Coronary artery disease    Hypertension    Osteoarthritis    Osteoporosis    Personal history of radiation therapy 2016   BREAST CA   PONV (postoperative nausea and vomiting)    SVT (supraventricular tachycardia) ([place]) 2012   Trigeminal neuralgia    Wears hearing aid in both ears     Past Surgical History:  Procedure Laterality Date   BREAST BIOPSY Right 1985   Negative   BREAST BIOPSY Right 03/02/2015   DCIS   BREAST CYST ASPIRATION Right 1985   Negative   BREAST FIBROADENOMA SURGERY Right 1982   [place]    BREAST LUMPECTOMY Right 03/26/2015   Procedure: BREAST LUMPECTOMY;  Surgeon: Robert Bellow, MD;  Location: [facility] ORS;  Service: General;  Laterality: Right;   BREAST MAMMOSITE Right 04/28/2015   BUNIONECTOMY WITH HAMMERTOE RECONSTRUCTION Left 2012   CARDIAC CATHETERIZATION     CATARACT EXTRACTION W/PHACO Right 12/08/2021   Procedure: CATARACT EXTRACTION PHACO AND INTRAOCULAR LENS PLACEMENT ([place]) RIGHT;  Surgeon: Leandrew Koyanagi, MD;  Location: [place];  Service: Ophthalmology;  Laterality: Right;  13.96 01:21.9    COLONOSCOPY  2013   Dr Vira Agar   CRANIOPLASTY  2014   [place]  2013   LEFT HEART CATH AND CORONARY ANGIOGRAPHY N/A 11/16/2018   Procedure: LEFT HEART CATH AND CORONARY ANGIOGRAPHY;  Surgeon: Nelva Bush, MD;  Location: [place] CV LAB;  Service:  Cardiovascular;  Laterality: N/A;   VAGINAL HYSTERECTOMY  1978    Prior to Admission medications   Medication Sig Start Date End Date Taking? Authorizing Provider  acetaminophen (TYLENOL) 500 MG tablet Take 500 mg by mouth every 6 (six) hours as needed.   Yes [provider]  alendronate (FOSAMAX) 70 MG tablet Take 70 mg by mouth once a week. Friday 10/27/15  Yes [provider]  aspirin EC 81 MG EC tablet Take 1 tablet (81 mg total) by mouth daily. 11/14/18  Yes Gladstone Lighter, MD  Biotin 5 MG TBDP Take 1 tablet by mouth daily.    Yes [provider]  cholecalciferol (VITAMIN D) 25 MCG (1000 UT) tablet Take 5,000 Units by mouth daily.    Yes [provider]  Cyanocobalamin (VITAMIN B-12) 1000 MCG SUBL Take 1 tablet by mouth daily.    Yes [provider]  Glucosamine-Chondroit-Vit C-Mn (GLUCOSAMINE CHONDR 1500 COMPLX) CAPS Take 2 capsules by mouth daily.    Yes [provider]  lamoTRIgine (LAMICTAL) 100 MG tablet Take 125-150 mg by mouth 2 (two) times daily. 125 mg QAM, 150 QPM 11/21/16  Yes [provider]  lamoTRIgine (LAMICTAL) 25 MG tablet Take 1 tablet in the morning and 2 tablets in the evening   Yes [provider]  losartan (COZAAR) 50 MG tablet TAKE 1 TABLET BY MOUTH  DAILY 06/30/21  Yes End, Harrell Gave, MD  Lutein 40 MG CAPS Take 1 capsule  by mouth daily.    Yes [provider]  metoprolol succinate (TOPROL-XL) 100 MG 24 hr tablet Take 1 tablet (100 mg total) by mouth daily. Take with or immediately following a meal. 11/10/21  Yes End, Harrell Gave, MD  Multiple Vitamin (MULTIVITAMIN) capsule Take 1 capsule by mouth daily.   Yes [provider]  nitroGLYCERIN (NITROSTAT) 0.4 MG SL tablet Place 1 tablet (0.4 mg total) under the tongue every 5 (five) minutes as needed for chest pain. 01/27/21  Yes End, Harrell Gave, MD  NON FORMULARY Take 700 mg by mouth daily. Garlic and ginger   Yes [provider]  psyllium (REGULOID) 0.52 g capsule Take 2,625 mg by mouth daily.   Yes [provider]  pyridOXINE (VITAMIN B-6) 50 MG tablet Take 50 mg by mouth daily.   Yes [provider]  rosuvastatin (CRESTOR) 40 MG tablet Take 1 tablet (40 mg total) by mouth daily. 09/14/21  Yes End, Harrell Gave, MD  vitamin C (ASCORBIC ACID) 500 MG tablet Take 500 mg by mouth daily.    Yes [provider]  XIIDRA 5 % SOLN Apply 1 drop to eye at bedtime. To both eyes 11/15/20  Yes [provider]    Allergies as of 10/06/2021   (No Known Allergies)    Family History  Problem Relation Age of Onset   Heart disease Father        died of heart attack and stroke at 56   Alzheimer's disease Mother    Breast cancer Paternal Aunt        mid 67's   Heart disease Maternal Grandfather    Heart disease Paternal Grandmother     Social History   Socioeconomic History   Marital status: Married    Spouse name: Joe   Number of children: 3   Years of education: Not on file   Highest education level: Not on file  Occupational History   Not on file  Tobacco Use   Smoking status: Never   Smokeless tobacco: Never  Vaping Use   Vaping Use: Never used  Substance and Sexual Activity   Alcohol use: Yes    Alcohol/week: 7.0 standard drinks of alcohol    Types: 7 Glasses of wine per week    Comment: 5 oz of wine 7 days per week   Drug use: No   Sexual activity: Not on file  Other Topics Concern   Not on file  Social History Narrative   Not on file   Social Determinants of Health   Financial Resource Strain: Low Risk  (11/16/2018)   Overall Financial Resource Strain (CARDIA)    Difficulty of Paying Living Expenses: Not very hard  Food Insecurity: No Food Insecurity (11/16/2018)   Hunger Vital Sign    Worried About Running Out of Food in the Last Year: Never true    Ran Out of Food in the Last Year: Never true  Transportation Needs: No Transportation Needs (11/16/2018)   PRAPARE  - Hydrologist (Medical): No    Lack of Transportation (Non-Medical): No  Physical Activity: Sufficiently Active (11/16/2018)   Exercise Vital Sign    Days of Exercise per Week: 4 days    Minutes of Exercise per Session: 40 min  Stress: No Stress Concern Present (11/16/2018)   [place]    Feeling of Stress : Only a little  Social Connections: Unknown (11/16/2018)   Social Connection and  Isolation Panel [NHANES]    Frequency of Communication with Friends and Family: More than three times a week    Frequency of Social Gatherings with Friends and Family: Not on file    Attends Religious Services: Not on file    Active Member of Clubs or Organizations: Not on file    Attends Archivist Meetings: Not on file    Marital Status: Not on file  Intimate Partner Violence: Not At Risk (11/16/2018)   Humiliation, Afraid, Rape, and Kick questionnaire    Fear of Current or Ex-Partner: No    Emotionally Abused: No    Physically Abused: No    Sexually Abused: No    Review of Systems: See HPI, otherwise negative ROS  Physical Exam: BP (!) 147/78   Pulse 79   Temp (!) 97.5 F (36.4 C) (Temporal)   Resp 14   Wt 57.2 kg   SpO2 98%   BMI 23.81 kg/m  General:   Alert,  pleasant and cooperative in NAD Head:  Normocephalic and atraumatic. Lungs:  Clear to auscultation.    Heart:  Regular rate and rhythm.   Impression/Plan: Kristin Davies is here for ophthalmic surgery.  Risks, benefits, limitations, and alternatives regarding ophthalmic surgery have been reviewed with the patient.  Questions have been answered.  All parties agreeable.   Leandrew Koyanagi, MD  12/29/2021, 11:41 AM

## 2021-12-29 NOTE — Anesthesia Preprocedure Evaluation (Addendum)
Anesthesia Evaluation  Patient identified by MRN, date of birth, ID band Patient awake    Reviewed: Allergy & Precautions, NPO status , Patient's Chart, lab work & pertinent test results, reviewed documented beta blocker date and time   History of Anesthesia Complications (+) PONV and history of anesthetic complications  Airway Mallampati: II  TM Distance: >3 FB Neck ROM: Full    Dental no notable dental hx.    Pulmonary neg pulmonary ROS,    Pulmonary exam normal        Cardiovascular hypertension, + CAD  Normal cardiovascular exam+ dysrhythmias (metoprolol) Supra Ventricular Tachycardia      Neuro/Psych H/o meningioma  Neuromuscular disease (Trigeminal neuralgia) negative psych ROS   GI/Hepatic negative GI ROS, Neg liver ROS,   Endo/Other  negative endocrine ROS  Renal/GU      Musculoskeletal  (+) Arthritis ,   Abdominal Normal abdominal exam  (+)   Peds  Hematology negative hematology ROS (+)   Anesthesia Other Findings H/o breast cancer  Reproductive/Obstetrics                            Anesthesia Physical  Anesthesia Plan  ASA: 3  Anesthesia Plan: MAC   Post-op Pain Management: Minimal or no pain anticipated   Induction: Intravenous  PONV Risk Score and Plan: 3 and TIVA, Midazolam and Treatment may vary due to age or medical condition  Airway Management Planned: Nasal Cannula and Natural Airway  Additional Equipment:   Intra-op Plan:   Post-operative Plan:   Informed Consent: I have reviewed the patients History and Physical, chart, labs and discussed the procedure including the risks, benefits and alternatives for the proposed anesthesia with the patient or authorized representative who has indicated his/her understanding and acceptance.     Dental advisory given  Plan Discussed with: CRNA  Anesthesia Plan Comments:        Anesthesia Quick Evaluation

## 2021-12-29 NOTE — Anesthesia Postprocedure Evaluation (Signed)
Anesthesia Post Note  Patient: Kristin Davies  Procedure(s) Performed: CATARACT EXTRACTION PHACO AND INTRAOCULAR LENS PLACEMENT (IOC) LEFT (Left: Eye)     Patient location during evaluation: PACU Anesthesia Type: MAC Level of consciousness: awake and alert Pain management: pain level controlled Vital Signs Assessment: post-procedure vital signs reviewed and stable Respiratory status: spontaneous breathing, nonlabored ventilation and respiratory function stable Cardiovascular status: blood pressure returned to baseline and stable Postop Assessment: no apparent nausea or vomiting Anesthetic complications: no   No notable events documented.  [place] Ouch

## 2021-12-29 NOTE — Transfer of Care (Signed)
Immediate Anesthesia Transfer of Care Note  Patient: Kristin Davies  Procedure(s) Performed: CATARACT EXTRACTION PHACO AND INTRAOCULAR LENS PLACEMENT (IOC) LEFT (Left: Eye)  Patient Location: PACU  Anesthesia Type: MAC  Level of Consciousness: awake, alert  and patient cooperative  Airway and Oxygen Therapy: Patient Spontanous Breathing and Patient connected to supplemental oxygen  Post-op Assessment: Post-op Vital signs reviewed, Patient's Cardiovascular Status Stable, Respiratory Function Stable, Patent Airway and No signs of Nausea or vomiting  Post-op Vital Signs: Reviewed and stable  Complications: No notable events documented.

## 2021-12-29 NOTE — Op Note (Signed)
OPERATIVE NOTE  KESHA HURRELL [id number] 12/29/2021   PREOPERATIVE DIAGNOSIS:  Nuclear sclerotic cataract left eye. H25.12   POSTOPERATIVE DIAGNOSIS:    Nuclear sclerotic cataract left eye.     PROCEDURE:  Phacoemusification with posterior chamber intraocular lens placement of the left eye  Ultrasound time: Procedure(s) with comments: CATARACT EXTRACTION PHACO AND INTRAOCULAR LENS PLACEMENT (IOC) LEFT (Left) - 5.77 0:56.8  LENS:   Implant Name Type Inv. Item Serial No. Manufacturer Lot No. LRB No. Used Action  LENS IOL TECNIS EYHANCE 21.0 - [id number] Intraocular Lens LENS IOL TECNIS EYHANCE 21.0 [id number] [employer]  Left 1 Implanted      SURGEON:  Wyonia Hough, MD   ANESTHESIA:  Topical with tetracaine drops and 2% Xylocaine jelly, augmented with 1% preservative-free intracameral lidocaine.    COMPLICATIONS:  None.   DESCRIPTION OF PROCEDURE:  The patient was identified in the holding room and transported to the operating room and placed in the supine position under the operating microscope.  The left eye was identified as the operative eye and it was prepped and draped in the usual sterile ophthalmic fashion.   A 1 millimeter clear-corneal paracentesis was made at the 1:30 position.  0.5 ml of preservative-free 1% lidocaine was injected into the anterior chamber.  The anterior chamber was filled with Viscoat viscoelastic.  A 2.4 millimeter keratome was used to make a near-clear corneal incision at the 10:30 position.  .  A curvilinear capsulorrhexis was made with a cystotome and capsulorrhexis forceps.  Balanced salt solution was used to hydrodissect and hydrodelineate the nucleus.   Phacoemulsification was then used in stop and chop fashion to remove the lens nucleus and epinucleus.  The remaining cortex was then removed using the irrigation and aspiration handpiece. Provisc was then placed into the capsular bag to distend it for lens placement.  A lens was then injected  into the capsular bag.  The remaining viscoelastic was aspirated.   Wounds were hydrated with balanced salt solution.  The anterior chamber was inflated to a physiologic pressure with balanced salt solution.  No wound leaks were noted. Cefuroxime 0.1 ml of a '10mg'$ /ml solution was injected into the anterior chamber for a dose of 1 mg of intracameral antibiotic at the completion of the case.   Timolol and Brimonidine drops were applied to the eye.  The patient was taken to the recovery room in stable condition without complications of anesthesia or surgery.  Deryk Bozman 12/29/2021, 12:42 PM

## 2021-12-29 NOTE — Anesthesia Procedure Notes (Signed)
Procedure Name: MAC Date/Time: 12/29/2021 12:26 PM  Performed by: Jerrye Noble, CRNAPre-anesthesia Checklist: Patient identified, Emergency Drugs available, Suction available and Patient being monitored Patient Re-evaluated:Patient Re-evaluated prior to induction Oxygen Delivery Method: Nasal cannula

## 2021-12-30 ENCOUNTER — Encounter: Payer: Self-pay | Admitting: Ophthalmology

## 2022-01-07 ENCOUNTER — Other Ambulatory Visit: Payer: Self-pay | Admitting: Internal Medicine

## 2022-02-27 ENCOUNTER — Emergency Department: Payer: Medicare Other

## 2022-02-27 ENCOUNTER — Emergency Department
Admission: EM | Admit: 2022-02-27 | Discharge: 2022-02-28 | Disposition: A | Discharge status: Home / Self Care | Payer: Medicare Other | Attending: Emergency Medicine | Admitting: Emergency Medicine

## 2022-02-27 ENCOUNTER — Other Ambulatory Visit: Payer: Self-pay

## 2022-02-27 ENCOUNTER — Encounter: Payer: Self-pay | Admitting: Emergency Medicine

## 2022-02-27 DIAGNOSIS — I10 Essential (primary) hypertension: Secondary | ICD-10-CM | POA: Diagnosis present

## 2022-02-27 DIAGNOSIS — I2510 Atherosclerotic heart disease of native coronary artery without angina pectoris: Secondary | ICD-10-CM | POA: Diagnosis not present

## 2022-02-27 LAB — CBC WITH DIFFERENTIAL/PLATELET
Abs Immature Granulocytes: 0.02 10*3/uL (ref 0.00–0.07)
Basophils Absolute: 0 10*3/uL (ref 0.0–0.1)
Basophils Relative: 0 %
Eosinophils Absolute: 0.1 10*3/uL (ref 0.0–0.5)
Eosinophils Relative: 1 %
HCT: 43.8 % (ref 36.0–46.0)
Hemoglobin: 13.9 g/dL (ref 12.0–15.0)
Immature Granulocytes: 0 %
Lymphocytes Relative: 48 %
Lymphs Abs: 3.3 10*3/uL (ref 0.7–4.0)
MCH: 31.4 pg (ref 26.0–34.0)
MCHC: 31.7 g/dL (ref 30.0–36.0)
MCV: 98.9 fL (ref 80.0–100.0)
Monocytes Absolute: 0.5 10*3/uL (ref 0.1–1.0)
Monocytes Relative: 8 %
Neutro Abs: 3 10*3/uL (ref 1.7–7.7)
Neutrophils Relative %: 43 %
Platelets: 229 10*3/uL (ref 150–400)
RBC: 4.43 MIL/uL (ref 3.87–5.11)
RDW: 13.5 % (ref 11.5–15.5)
WBC: 6.9 10*3/uL (ref 4.0–10.5)
nRBC: 0 % (ref 0.0–0.2)

## 2022-02-27 LAB — BASIC METABOLIC PANEL
Anion gap: 12 (ref 5–15)
BUN: 19 mg/dL (ref 8–23)
CO2: 27 mmol/L (ref 22–32)
Calcium: 10.3 mg/dL (ref 8.9–10.3)
Chloride: 102 mmol/L (ref 98–111)
Creatinine, Ser: 0.85 mg/dL (ref 0.44–1.00)
GFR, Estimated: >60 mL/min (ref >60)
Glucose, Bld: 117 mg/dL — ABNORMAL HIGH (ref 70–99)
Potassium: 4 mmol/L (ref 3.5–5.1)
Sodium: 141 mmol/L (ref 135–145)

## 2022-02-27 LAB — TROPONIN I (HIGH SENSITIVITY)
Troponin I (High Sensitivity): 7 ng/L (ref <18)
Troponin I (High Sensitivity): 12 ng/L (ref <18)

## 2022-02-27 NOTE — ED Provider Triage Note (Signed)
Emergency Medicine Provider Triage Evaluation Note  Kristin Davies , a 76 y.o. female  was evaluated in triage.  Pt complains of HTN. Takes metoprolol daily. Took her blood pressure today and it was 180s/110s. Has mild CP that developed after. Reports mild nausea. Described her pain as dull and only with moving her left arm. No SOB. Walks every morning including today and no chest pain. Reports she has cardiology appt this Friday with Dr. 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  Review of Systems  Positive: CP, nausea Negative: SOB/ diaphoresis  Physical Exam  There were no vitals taken for this visit. Gen:   Awake, no distress   Resp:  Normal effort  MSK:   Moves extremities without difficulty  Other:    Medical Decision Making  Medically screening exam initiated at 6:10 PM.  Appropriate orders placed.  Kristin Davies was informed that the remainder of the evaluation will be completed by another provider, this initial triage assessment does not replace that evaluation, and the importance of remaining in the ED until their evaluation is complete.     [name], PA-C 02/27/22 1817

## 2022-02-27 NOTE — ED Notes (Signed)
PA in triage room for MSE.

## 2022-02-27 NOTE — ED Triage Notes (Signed)
Pt to ED from home c/o HTN today.  States takes meds (metoprolol and lopressor) for it but when she checked BP at home got 181/113 then 190/112.  States now having some pain to left side and arm that is dull.  Denies SOB, having some nausea without vomiting.  Cardiologist is Dr. Saunders Revel.  Pt A&Ox4, chest rise even and unlabored, skin WNL and in NAD at this time.

## 2022-02-28 ENCOUNTER — Encounter: Payer: Self-pay | Admitting: Internal Medicine

## 2022-02-28 NOTE — ED Provider Notes (Signed)
[facility] Provider Note    Event Date/Time   First MD Initiated Contact with Patient 02/28/22 0009     (approximate)   History   Hypertension   HPI  Kristin Davies is a 76 y.o. female with history of hypertension and coronary artery disease who goes to Dr. Saunders Revel for cardiology.  She presents tonight with concerns for hypertension.  She states that she has felt "out of sorts" for a few days but without any specific symptoms.  She has been compliant with her medications including her metoprolol and losartan.  She checked her blood pressure earlier today and it was quite elevated.  Each time she checked it it went higher and the last time she checked at home was about 190/112.  She was concerned at that point and asked her husband to bring her to the emergency department.  At no point has she had any chest pain.  She denies shortness of breath, nausea, vomiting, visual changes, headache.  She has not felt ill recently with any fever or viral illness.  She has been eating and drinking normally and has been taking her medications.  She said that she has not been as careful about her diet as usual and admits that she could have been eating more salty food than is normal for her.  She has an appointment with Dr. Saunders Revel scheduled for about 5 days from now as a regular appointment.     Physical Exam   Triage Vital Signs: ED Triage Vitals  Enc Vitals Group     BP 02/27/22 1810 (!) 187/94     Pulse Rate 02/27/22 1810 83     Resp 02/27/22 1810 18     Temp 02/27/22 1810 98.1 F (36.7 C)     Temp Source 02/27/22 1810 Oral     SpO2 02/27/22 1810 96 %     Weight 02/27/22 1810 54.4 kg (120 lb)     Height 02/27/22 1810 1.549 m ('5\' 1"'$ )     Head Circumference --      Peak Flow --      Pain Score 02/27/22 1815 1     Pain Loc --      Pain Edu? --      Excl. in [place]? --     Most recent vital signs: Vitals:   02/27/22 2152 02/28/22 0150  BP: (!) 181/95 (!) 174/97  Pulse: 89  69  Resp: 16 17  Temp: 97.8 F (36.6 C) 97.9 F (36.6 C)  SpO2: 98% 98%     General: Awake, no distress.  CV:  Good peripheral perfusion.  Resp:  Normal effort.  Abd:  No distention.  Other:  Normal mood and affect, pleasant and conversational, no aphasia or dysarthria.  No focal neurological deficits appreciated patient is ambulatory without difficulty.   ED Results / Procedures / Treatments   Labs (all labs ordered are listed, but only abnormal results are displayed) Labs Reviewed  BASIC METABOLIC PANEL - Abnormal; Notable for the following components:      Result Value   Glucose, Bld 117 (*)    All other components within normal limits  CBC WITH DIFFERENTIAL/PLATELET  TROPONIN I (HIGH SENSITIVITY)  TROPONIN I (HIGH SENSITIVITY)     EKG  ED ECG REPORT I, Hinda Kehr, the attending physician, personally viewed and interpreted this ECG.  Date: 02/27/2022 EKG Time: 18: 16 Rate: 87 Rhythm: normal sinus rhythm QRS Axis: Left axis deviation Intervals: normal ST/T Wave  abnormalities: Non-specific ST segment / T-wave changes, but no clear evidence of acute ischemia. Narrative Interpretation: no definitive evidence of acute ischemia; does not meet STEMI criteria.    RADIOLOGY I viewed and interpreted the patient's two-view chest x-ray.  I see no evidence of pneumonia or interstitial edema.  I also read the radiologist's report, which confirmed no acute findings.    PROCEDURES:  Critical Care performed: No  Procedures   MEDICATIONS ORDERED IN ED: Medications - No data to display   IMPRESSION / MDM / [place] / ED COURSE  I reviewed the triage vital signs and the nursing notes.                              Differential diagnosis includes, but is not limited to, essential hypertension, medication noncompliance, dietary indiscretions, hypertensive urgency/emergency, acute kidney disease or renal failure, ACS.  Patient's presentation is most  consistent with acute presentation with potential threat to life or bodily function.  Patient remains persistently hypertensive in the emergency department but is asymptomatic.  Labs/studies ordered include EKG, two-view chest x-ray, CBC with differential, high-sensitivity troponin x2, and basic metabolic panel.  All of her labs are within normal limits.  No sign of ischemia on EKG and no acute abnormalities on chest x-ray as documented above.  Because the patient has close follow-up available to her with Dr. Saunders Revel, I had my usual asymptomatic hypertension discussion with the patient and her husband.  We all agreed that it would be best managed as an outpatient since this just may be an outlier or due to dietary indiscretions.  She will keep a journal of her blood pressure and follow-up as scheduled in 4 to 5 days with Dr. Saunders Revel.  I also sent a message through epic inbasket to make him aware that she had been here in case he wants to reach out to his staff and have them contact her sooner.  The patient understands and agrees with the plan and is satisfied with the evaluation.  She is in no distress and is ready at home and will follow-up as an outpatient.  I gave my usual return precautions.      FINAL CLINICAL IMPRESSION(S) / ED DIAGNOSES   Final diagnoses:  Uncontrolled hypertension     Rx / DC Orders   ED Discharge Orders     None        Note:  This document was prepared using Dragon voice recognition software and may include unintentional dictation errors.   Hinda Kehr, MD 02/28/22 [phone]

## 2022-02-28 NOTE — Discharge Instructions (Signed)

## 2022-03-04 ENCOUNTER — Encounter: Payer: Self-pay | Admitting: Internal Medicine

## 2022-03-04 ENCOUNTER — Ambulatory Visit: Payer: Medicare Other | Attending: Internal Medicine | Admitting: Internal Medicine

## 2022-03-04 VITALS — BP 130/80 | HR 75 | Ht 61.0 in | Wt 125.0 lb

## 2022-03-04 DIAGNOSIS — E785 Hyperlipidemia, unspecified: Secondary | ICD-10-CM

## 2022-03-04 DIAGNOSIS — I2510 Atherosclerotic heart disease of native coronary artery without angina pectoris: Secondary | ICD-10-CM

## 2022-03-04 DIAGNOSIS — I471 Supraventricular tachycardia, unspecified: Secondary | ICD-10-CM | POA: Diagnosis not present

## 2022-03-04 DIAGNOSIS — I10 Essential (primary) hypertension: Secondary | ICD-10-CM

## 2022-03-04 NOTE — Patient Instructions (Signed)
Medication Instructions:  Your physician recommends that you continue on your current medications as directed. Please refer to the Current Medication list given to you today.  *If you need a refill on your cardiac medications before your next appointment, please call your pharmacy*  Lab Work: NONE ordered at this time of appointment   If you have labs (blood work) drawn today and your tests are completely normal, you will receive your results only by: [place] (if you have MyChart) OR A paper copy in the mail If you have any lab test that is abnormal or we need to change your treatment, we will call you to review the results.  Testing/Procedures: NONE ordered at this time of appointment   Follow-Up: At [facility], you and your health needs are our priority.  As part of our continuing mission to provide you with exceptional heart care, we have created designated Provider Care Teams.  These Care Teams include your primary Cardiologist (physician) and Advanced Practice Providers (APPs -  Physician Assistants and Nurse Practitioners) who all work together to provide you with the care you need, when you need it.   Your next appointment:   1 year(s)  The format for your next appointment:   In Person  Provider:   You may see Nelva Bush, MD or one of the following Advanced Practice Providers on your designated Care Team:   Murray Hodgkins, NP Christell Faith, PA-C Cadence Kathlen Mody, PA-C Gerrie Nordmann, NP    Other Instructions   Important Information About [place]

## 2022-03-04 NOTE — Progress Notes (Signed)
Follow-up Outpatient Visit Date: 03/04/2022  Primary Care Provider: Marinda Elk, MD [place] [id number]  Chief Complaint: Follow-up hypertension and nonobstructive coronary artery disease  HPI:  Kristin Davies is a 76 y.o. female with history of nonobstructive coronary artery disease (mild-moderate by [facility] 10/2018), paroxysmal supraventricular tachycardia, hypertension, hyperlipidemia, meningioma, DCIS of the breast, and trigeminal neuralgia who presents for follow-up of CAD, PSVT, and HTN.  I last saw her a year ago at which time she was feeling fairly well.  She presented to the emergency department this past weekend due to elevated blood pressure in the setting of feeling "out of sorts."  Her blood pressure was elevated at 187/94 at triage.  She was asymptomatic throughout her ED stay with unremarkable work-up.  Close cardiology follow-up without additional testing and medication changes was recommended.  Today, [name] reports she is feeling well with blood pressures back to her baseline.  Leading up to the elevated blood pressure in ED visit this weekend, she was feeling "crabby and irritable."  She did not have any other specific symptoms.  She denies chest pain and shortness of breath.  She has not had any rapid or irregular heartbeats but is somewhat aware of her heart beating.  She notes that she was diagnosed with peripheral neuropathy in March and subsequently developed issues with right hip bursitis and a pinched nerve in her back.  She has received injections in her back and right hip and continues to follow with orthopedics.  --------------------------------------------------------------------------------------------------  Past Medical History:  Diagnosis Date   Angina at rest    Arthritis    Breast cancer ([place]) 2016   DCIS, ER negative, PR negative, high-grade. 3.5 cm. 1 mm deep margin (pectoralis fascia). MammoSite radiation.    Cancer ([place]) 03/26/2015   DCIS, ER negative, PR negative, high-grade. 3.5 cm. 1 mm deep margin (pectoralis fascia).   Coronary artery disease    Hypertension    Osteoarthritis    Osteoporosis    Personal history of radiation therapy 2016   BREAST CA   PONV (postoperative nausea and vomiting)    SVT (supraventricular tachycardia) 2012   Trigeminal neuralgia    Wears hearing aid in both ears    Past Surgical History:  Procedure Laterality Date   BREAST BIOPSY Right 1985   Negative   BREAST BIOPSY Right 03/02/2015   DCIS   BREAST CYST ASPIRATION Right 1985   Negative   BREAST FIBROADENOMA SURGERY Right 1982   [place]    BREAST LUMPECTOMY Right 03/26/2015   Procedure: BREAST LUMPECTOMY;  Surgeon: Robert Bellow, MD;  Location: [facility];  Service: General;  Laterality: Right;   BREAST MAMMOSITE Right 04/28/2015   BUNIONECTOMY WITH HAMMERTOE RECONSTRUCTION Left 2012   CARDIAC CATHETERIZATION     CATARACT EXTRACTION W/PHACO Right 12/08/2021   Procedure: CATARACT EXTRACTION PHACO AND INTRAOCULAR LENS PLACEMENT ([place]) RIGHT;  Surgeon: Leandrew Koyanagi, MD;  [place] [facility];  Service: Ophthalmology;  Laterality: Right;  13.96 01:21.9    CATARACT EXTRACTION W/PHACO Left 12/29/2021   Procedure: CATARACT EXTRACTION PHACO AND INTRAOCULAR LENS PLACEMENT ([place]) LEFT;  Surgeon: Leandrew Koyanagi, MD;  Location: [facility];  Service: Ophthalmology;  Laterality: Left;  5.77 0:56.8   COLONOSCOPY  2013   Dr Vira Agar   CRANIOPLASTY  2014   [place]  2013   LEFT HEART CATH AND CORONARY ANGIOGRAPHY N/A 11/16/2018   Procedure: LEFT HEART CATH AND CORONARY ANGIOGRAPHY;  Surgeon: Nelva Bush, MD;  Location: [facility];  Service: Cardiovascular;  Laterality: N/A;   VAGINAL HYSTERECTOMY  1978    Current Meds  Medication Sig   acetaminophen (TYLENOL) 500 MG tablet Take 500 mg by mouth every 6 (six) hours as needed.   alendronate  (FOSAMAX) 70 MG tablet Take 70 mg by mouth once a week. Friday   aspirin EC 81 MG EC tablet Take 1 tablet (81 mg total) by mouth daily.   Biotin 5 MG TBDP Take 1 tablet by mouth daily.    cholecalciferol (VITAMIN D) 25 MCG (1000 UT) tablet Take 5,000 Units by mouth daily.    Cyanocobalamin (VITAMIN B-12) 1000 MCG SUBL Take 1 tablet by mouth daily.    gabapentin (NEURONTIN) 100 MG capsule Take 1 capsule by mouth 2 (two) times daily.   Glucosamine-Chondroit-Vit C-Mn (GLUCOSAMINE CHONDR 1500 COMPLX) CAPS Take 2 capsules by mouth daily.    lamoTRIgine (LAMICTAL) 100 MG tablet Take 125-150 mg by mouth 2 (two) times daily. 125 mg QAM, 150 QPM   lamoTRIgine (LAMICTAL) 25 MG tablet Take 1 tablet in the morning and 2 tablets in the evening   losartan (COZAAR) 50 MG tablet TAKE 1 TABLET BY MOUTH DAILY   Lutein 40 MG CAPS Take 1 capsule by mouth daily.    metoprolol succinate (TOPROL-XL) 100 MG 24 hr tablet Take 1 tablet (100 mg total) by mouth daily. Take with or immediately following a meal.   Multiple Vitamin (MULTIVITAMIN) capsule Take 1 capsule by mouth daily.   nitroGLYCERIN (NITROSTAT) 0.4 MG SL tablet Place 1 tablet (0.4 mg total) under the tongue every 5 (five) minutes as needed for chest pain.   NON FORMULARY Take 700 mg by mouth daily. Garlic and ginger   psyllium (REGULOID) 0.52 g capsule Take 2,625 mg by mouth daily.   pyridOXINE (VITAMIN B-6) 50 MG tablet Take 50 mg by mouth daily.   rosuvastatin (CRESTOR) 40 MG tablet Take 1 tablet (40 mg total) by mouth daily.   vitamin C (ASCORBIC ACID) 500 MG tablet Take 500 mg by mouth daily.    XIIDRA 5 % SOLN Apply 1 drop to eye at bedtime. To both eyes    Allergies: Patient has no known allergies.  Social History   Tobacco Use   Smoking status: Never   Smokeless tobacco: Never  Vaping Use   Vaping Use: Never used  Substance Use Topics   Alcohol use: Yes    Alcohol/week: 7.0 standard drinks of alcohol    Types: 7 Glasses of wine per week     Comment: 5 oz of wine 7 days per week   Drug use: No    Family History  Problem Relation Age of Onset   Heart disease Father        died of heart attack and stroke at 40   Alzheimer's disease Mother    Breast cancer Paternal Aunt        mid 51's   Heart disease Maternal Grandfather    Heart disease Paternal Grandmother     Review of Systems: A 12-system review of systems was performed and was negative except as noted in the HPI.  --------------------------------------------------------------------------------------------------  Physical Exam: BP 130/80 (BP Location: Left Arm, Patient Position: Sitting, Cuff Size: Normal)   Pulse 75   Ht '5\' 1"'$  (1.549 m)   Wt 125 lb (56.7 kg)   SpO2 96%   BMI 23.62 kg/m   General:  NAD. Neck: No JVD or HJR.  Lungs: Clear to auscultation bilaterally without wheezes or crackles. Heart: Regular rate and rhythm without murmurs, rubs, or gallops. Abdomen: Soft, nontender, nondistended. Extremities: No lower extremity edema.  EKG: Normal sinus rhythm with low voltage.  No significant change from prior tracing on 02/27/2022.  Lab Results  Component Value Date   WBC 6.9 02/27/2022   HGB 13.9 02/27/2022   HCT 43.8 02/27/2022   MCV 98.9 02/27/2022   PLT 229 02/27/2022    Lab Results  Component Value Date   NA 141 02/27/2022   K 4.0 02/27/2022   CL 102 02/27/2022   CO2 27 02/27/2022   BUN 19 02/27/2022   CREATININE 0.85 02/27/2022   GLUCOSE 117 (H) 02/27/2022   ALT 28 01/31/2019    Lab Results  Component Value Date   CHOL 164 01/31/2019   HDL 82 01/31/2019   LDLCALC 66 01/31/2019   TRIG 78 01/31/2019   CHOLHDL 2.0 01/31/2019    --------------------------------------------------------------------------------------------------  ASSESSMENT AND PLAN: Hypertension: Blood pressure well controlled today and also back to normal the last few days.  I it is unclear to me what caused her spike in blood pressure leading to her ED  visit over the weekend.  Given that she was feeling vaguely unwell for a few days prior to this, I wonder if she may have had an acute infectious process.  Physical exam today is benign.  Recent ED evaluation was also unremarkable.  We will defer medication changes.  Coronary artery disease: No angina reported in the setting of nonobstructive CAD by catheterization in 2020.  Continue current medications for secondary prevention.  PSVT: No significant palpitations reported.  EKG today shows sinus rhythm.  Continue current dose of metoprolol succinate.  Hyperlipidemia: LDL just above goal on last check in 04/2021.  I encouraged [name] to continue taking rosuvastatin 40 mg daily and keep working on diet and exercise to help improve her lipids.  She will have a follow-up lipid panel with her PCP in about a month.  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  Continue aspirin and statin therapy.  Follow-up: Return to clinic in 1 year.  Nelva Bush, MD 03/04/2022 10:53 AM

## 2022-03-28 ENCOUNTER — Other Ambulatory Visit: Payer: Self-pay | Admitting: Physician Assistant

## 2022-03-28 DIAGNOSIS — Z1231 Encounter for screening mammogram for malignant neoplasm of breast: Secondary | ICD-10-CM

## 2022-04-10 ENCOUNTER — Other Ambulatory Visit: Payer: Self-pay | Admitting: Internal Medicine

## 2022-04-18 ENCOUNTER — Other Ambulatory Visit: Payer: Self-pay | Admitting: Internal Medicine

## 2022-05-05 ENCOUNTER — Encounter: Payer: Self-pay | Admitting: Internal Medicine

## 2022-05-06 ENCOUNTER — Telehealth: Payer: Self-pay | Admitting: Internal Medicine

## 2022-05-06 MED ORDER — METOPROLOL SUCCINATE ER 100 MG PO TB24: 100.0000 mg | ORAL_TABLET | Freq: Every day | ORAL | 2 refills | Status: DC

## 2022-05-06 NOTE — Telephone Encounter (Signed)
Requested Prescriptions   Signed Prescriptions Disp Refills   metoprolol succinate (TOPROL-XL) 100 MG 24 hr tablet 90 tablet 2    Sig: Take 1 tablet (100 mg total) by mouth daily. Take with or immediately following a meal.    Authorizing Provider: END, CHRISTOPHER    Ordering User: NEWCOMER MCCLAIN, Whitney Bingaman L

## 2022-05-06 NOTE — Telephone Encounter (Signed)
*  STAT* If patient is at the pharmacy, call can be transferred to refill team.   1. Which medications need to be refilled? (please list name of each medication and dose if known) metoprolol succinate (TOPROL-XL) 100 MG 24 hr tablet   2. Which pharmacy/location (including street and city if local pharmacy) is medication to be sent to?   [facility] - [place], [place] - [address]    3. Do they need a 30 day or 90 day supply? [place]

## 2022-05-17 ENCOUNTER — Encounter: Payer: Self-pay | Admitting: Physician Assistant

## 2022-05-20 ENCOUNTER — Other Ambulatory Visit: Payer: Self-pay | Admitting: Internal Medicine

## 2022-05-20 DIAGNOSIS — N6311 Unspecified lump in the right breast, upper outer quadrant: Secondary | ICD-10-CM

## 2022-05-25 ENCOUNTER — Ambulatory Visit
Admission: RE | Admit: 2022-05-25 | Discharge: 2022-05-25 | Disposition: A | Discharge status: Home / Self Care | Payer: Medicare Other | Source: Ambulatory Visit | Attending: Internal Medicine | Admitting: Internal Medicine

## 2022-05-25 DIAGNOSIS — N6311 Unspecified lump in the right breast, upper outer quadrant: Secondary | ICD-10-CM | POA: Insufficient documentation

## 2022-06-07 ENCOUNTER — Other Ambulatory Visit: Payer: Medicare Other

## 2022-09-11 ENCOUNTER — Other Ambulatory Visit: Payer: Self-pay | Admitting: Internal Medicine

## 2022-09-29 ENCOUNTER — Other Ambulatory Visit: Payer: Self-pay | Admitting: Internal Medicine

## 2022-10-03 ENCOUNTER — Other Ambulatory Visit: Payer: Self-pay | Admitting: Internal Medicine

## 2022-10-13 ENCOUNTER — Other Ambulatory Visit: Payer: Self-pay | Admitting: Internal Medicine

## 2022-11-24 ENCOUNTER — Other Ambulatory Visit: Payer: Self-pay | Admitting: Internal Medicine

## 2022-12-18 ENCOUNTER — Other Ambulatory Visit: Payer: Self-pay | Admitting: Internal Medicine

## 2023-01-04 ENCOUNTER — Other Ambulatory Visit: Payer: Self-pay | Admitting: Internal Medicine

## 2023-01-05 NOTE — Telephone Encounter (Signed)
Left voice mail to schedule appt

## 2023-01-05 NOTE — Telephone Encounter (Signed)
Refill Request.  

## 2023-02-24 ENCOUNTER — Other Ambulatory Visit: Payer: Self-pay | Admitting: Internal Medicine

## 2023-03-04 NOTE — Progress Notes (Unsigned)
Cardiology Office Note    Date:  03/07/2023   ID:  Kristin Davies, DOB 08-04-1945, MRN [id number]  PCP:  Patrice Paradise, MD  Cardiologist:  Yvonne Kendall, MD  Electrophysiologist:  None   Chief Complaint: Follow-up  History of Present Illness:   Kristin Davies is a 77 y.o. female with history of nonobstructive CAD by LHC in 10/2018, PSVT, DCIS of the breast, trigeminal neuralgia, meningioma, HTN, HLD, celiac artery stenosis, and lumbar radiculopathy who presents for follow-up of CAD, PSVT, and HTN.  Nuclear stress test in 10/2018 was abnormal, and potentially high risk with a moderate in size, mild in severity, reversible defect involving the mid and apical anterior/anterolateral segments concerning for ischemia, though artifact could not be excluded.  EF greater than 65%.  CT attenuation corrected images showed coronary calcification and aortic atherosclerosis.  LHC in 10/2018 showed mild to moderate nonobstructive CAD with normal LV systolic function and filling pressure.  Renal artery ultrasound in 07/2019 showed no evidence of renal artery stenosis bilaterally with a stenosis of the celiac artery noted.  Echo in 02/2020 showed an EF of 60 to 65%, no regional wall motion abnormalities, grade 1 diastolic dysfunction, normal RV systolic function and ventricular cavity size, normal PASP, mildly dilated left atrium, mild to moderate mitral regurgitation, mild aortic insufficiency, mild aortic stenosis, and an estimated right atrial pressure of 3 mmHg.  She was last seen in the office in 02/2022 and was without symptoms of angina or cardiac decompensation.  Previously noted elevated BP, which required ED visit in 2023, was back to baseline.  She comes in doing well from a cardiac perspective and is without symptoms of angina or cardiac decompensation.  No palpitations, dizziness, presyncope, or syncope.  Continues to struggle with lumbar radiculopathy, peripheral neuropathy, and arthritis as her  main limiting factors.  Blood pressure has been well-controlled at home when she checks it.  She does not have any active cardiac concerns at this time.   Labs independently reviewed: 10/2022 - potassium 4.5, BUN 21, serum creatinine 0.9, albumin 4.4, AST/ALT normal, A1c 5.7, TC 164, TG 136, HDL 74, LDL 62 04/2022 - Hgb 13.9, PLT 237 07/2021 - TSH normal  Past Medical History:  Diagnosis Date   Angina at rest [facility])    Arthritis    Breast cancer (HCC) 2016   DCIS, ER negative, PR negative, high-grade. 3.5 cm. 1 mm deep margin (pectoralis fascia). MammoSite radiation.   Cancer (HCC) 03/26/2015   DCIS, ER negative, PR negative, high-grade. 3.5 cm. 1 mm deep margin (pectoralis fascia).   Coronary artery disease    Hypertension    Osteoarthritis    Osteoporosis    Peripheral neuropathy 07/2020   Personal history of radiation therapy 2016   BREAST CA   PONV (postoperative nausea and vomiting)    SVT (supraventricular tachycardia) (HCC) 2012   Trigeminal neuralgia    Wears hearing aid in both ears     Past Surgical History:  Procedure Laterality Date   BREAST BIOPSY Right 1985   Negative   BREAST BIOPSY Right 03/02/2015   DCIS   BREAST CYST ASPIRATION Right 1985   Negative   BREAST FIBROADENOMA SURGERY Right 1982   [place]    BREAST LUMPECTOMY Right 03/26/2015   Procedure: BREAST LUMPECTOMY;  Surgeon: Earline Mayotte, MD;  Location: [facility] ORS;  Service: General;  Laterality: Right;   BREAST MAMMOSITE Right 04/28/2015   BUNIONECTOMY WITH HAMMERTOE RECONSTRUCTION Left 2012   CARDIAC CATHETERIZATION  CATARACT EXTRACTION W/PHACO Right 12/08/2021   Procedure: CATARACT EXTRACTION PHACO AND INTRAOCULAR LENS PLACEMENT (IOC) RIGHT;  Surgeon: Lockie Mola, MD;  Location: [facility];  Service: Ophthalmology;  Laterality: Right;  13.96 01:21.9    CATARACT EXTRACTION W/PHACO Left 12/29/2021   Procedure: CATARACT EXTRACTION PHACO AND INTRAOCULAR LENS PLACEMENT (IOC)  LEFT;  Surgeon: Lockie Mola, MD;  Location: [facility];  Service: Ophthalmology;  Laterality: Left;  5.77 0:56.8   COLONOSCOPY  2013   Dr Mechele Collin   CRANIOPLASTY  2014   HALLUX VALGUS CORRECTION  2013   LEFT HEART CATH AND CORONARY ANGIOGRAPHY N/A 11/16/2018   Procedure: LEFT HEART CATH AND CORONARY ANGIOGRAPHY;  Surgeon: Yvonne Kendall, MD;  Location: [facility] INVASIVE CV LAB;  Service: Cardiovascular;  Laterality: N/A;   VAGINAL HYSTERECTOMY  1978    Current Medications: Current Meds  Medication Sig   acetaminophen (TYLENOL) 500 MG tablet Take 500 mg by mouth every 6 (six) hours as needed.   alendronate (FOSAMAX) 70 MG tablet Take 70 mg by mouth once a week. Friday   aspirin EC 81 MG EC tablet Take 1 tablet (81 mg total) by mouth daily.   Biotin 5 MG TBDP Take 1 tablet by mouth daily.    cholecalciferol (VITAMIN D) 25 MCG (1000 UT) tablet Take 5,000 Units by mouth daily.    Cyanocobalamin (VITAMIN B-12) 1000 MCG SUBL Take 1 tablet by mouth daily.    Glucosamine-Chondroit-Vit C-Mn (GLUCOSAMINE CHONDR 1500 COMPLX) CAPS Take 2 capsules by mouth daily.    lamoTRIgine (LAMICTAL) 100 MG tablet Take 125-150 mg by mouth 2 (two) times daily. 125 mg QAM, 150 QPM   lamoTRIgine (LAMICTAL) 25 MG tablet Take 1 tablet in the morning and 2 tablets in the evening   losartan (COZAAR) 50 MG tablet TAKE 1 TABLET BY MOUTH DAILY   Lutein 40 MG CAPS Take 1 capsule by mouth daily.    metoprolol succinate (TOPROL-XL) 100 MG 24 hr tablet TAKE 1 TABLET BY MOUTH DAILY  WITH OR IMMEDIATELY FOLLOWING A  MEAL   Multiple Vitamin (MULTIVITAMIN) capsule Take 1 capsule by mouth daily.   nitroGLYCERIN (NITROSTAT) 0.4 MG SL tablet Place 1 tablet (0.4 mg total) under the tongue every 5 (five) minutes as needed for chest pain.   NON FORMULARY Take 700 mg by mouth daily. Garlic and ginger   psyllium (REGULOID) 0.52 g capsule Take 2,625 mg by mouth daily. PRN   rosuvastatin (CRESTOR) 40 MG tablet Take 1 tablet  (40 mg total) by mouth daily. PLEASE CALL OFFICE TO SCHEDULE APPOINTMENT PRIOR TO NEXT REFILL   vitamin C (ASCORBIC ACID) 500 MG tablet Take 500 mg by mouth daily.    XIIDRA 5 % SOLN Apply 1 drop to eye at bedtime. To both eyes    Allergies:   Patient has no known allergies.   Social History   Socioeconomic History   Marital status: Married    Spouse name: Joe   Number of children: 3   Years of education: Not on file   Highest education level: Not on file  Occupational History   Not on file  Tobacco Use   Smoking status: Never   Smokeless tobacco: Never  Vaping Use   Vaping status: Never Used  Substance and Sexual Activity   Alcohol use: Yes    Alcohol/week: 7.0 standard drinks of alcohol    Types: 7 Glasses of wine per week    Comment: 5 oz of wine 7 days per week  Drug use: No   Sexual activity: Not on file  Other Topics Concern   Not on file  Social History Narrative   Not on file   Social Determinants of Health   Financial Resource Strain: Low Risk  (11/16/2018)   Overall Financial Resource Strain (CARDIA)    Difficulty of Paying Living Expenses: Not very hard  Food Insecurity: No Food Insecurity (11/16/2018)   Hunger Vital Sign    Worried About Running Out of Food in the Last Year: Never true    Ran Out of Food in the Last Year: Never true  Transportation Needs: No Transportation Needs (11/16/2018)   PRAPARE - Administrator, Civil Service (Medical): No    Lack of Transportation (Non-Medical): No  Physical Activity: Sufficiently Active (11/16/2018)   Exercise Vital Sign    Days of Exercise per Week: 4 days    Minutes of Exercise per Session: 40 min  Stress: No Stress Concern Present (11/16/2018)   [employer] of Occupational Health - Occupational Stress Questionnaire    Feeling of Stress : Only a little  Social Connections: Unknown (11/16/2018)   Social Connection and Isolation Panel [NHANES]    Frequency of Communication with Friends and  Family: More than three times a week    Frequency of Social Gatherings with Friends and Family: Not on file    Attends Religious Services: Not on file    Active Member of Clubs or Organizations: Not on file    Attends Banker Meetings: Not on file    Marital Status: Not on file     Family History:  The patient's family history includes Alzheimer's disease in her mother; Breast cancer in her paternal aunt; Heart disease in her father, maternal grandfather, and paternal grandmother.  ROS:   12-point review of systems is negative unless otherwise noted in the HPI.   EKGs/Labs/Other Studies Reviewed:    Studies reviewed were summarized above. The additional studies were reviewed today:  2D echo 03/05/2020: 1. Left ventricular ejection fraction, by estimation, is 60 to 65%. The  left ventricle has normal function. The left ventricle has no regional  wall motion abnormalities. Left ventricular diastolic parameters are  consistent with Grade I diastolic  dysfunction (impaired relaxation).   2. Right ventricular systolic function is normal. The right ventricular  size is normal. There is normal pulmonary artery systolic pressure.   3. Left atrial size was mildly dilated.   4. The mitral valve is normal in structure. Mild to moderate mitral valve  regurgitation.   5. The aortic valve has an indeterminant number of cusps. Aortic valve  regurgitation is mild. Mild aortic valve stenosis.   6. The inferior vena cava is normal in size with greater than 50%  respiratory variability, suggesting right atrial pressure of 3 mmHg.  __________  Renal artery ultrasound 08/09/2019: Summary:  Largest Aortic Diameter: 2.2 cm    Renal:    Right: Normal size right kidney. Normal right Resisitive Index.         Normal cortical thickness of right kidney. No evidence of         right renal artery stenosis. RRV flow present.  Left:  No evidence of left renal artery stenosis. Normal size of          left kidney. Normal left Resistive Index. LRV flow present.         Normal cortical thickness of the left kidney.  Mesenteric:  Normal Superior Mesenteric artery findings. 70  to 99% stenosis in the  celiac artery.  __________  LHC 11/16/2018: Conclusions: Mild to moderate, non-obstructive coronary artery disease. Normal left ventricular systolic function and filling pressure.   Recommendations: Continue current medical therapy and risk factor modification. Follow-up in the office in ~2 weeks. __________  Eugenie Birks MPI 11/13/2018:   Abnormal, potentially high risk pharmacologic myocardial perfusion stress test. There is a moderate in size, mild in severity, reversible defect involving the mid and apical anterior/anterolateral segments concerning for ischemia though artifact cannot be excluded (breast attenuation and misregistration). Left ventricular systolic function is normal (LVEF > 65%). Borderline transient ischemic dilation was noted, which is nonspecific but can be seen with balanced ischemia. Attenuation correction CT demonstrates coronary artery and aortic atherosclerotic calcification.   EKG:  EKG is ordered today.  The EKG ordered today demonstrates NSR, 81 bpm, left axis deviation, poor R wave progression along the precordial leads, no acute ST-T changes, consistent with prior tracing  Recent Labs: No results found for requested labs within last 365 days.  Recent Lipid Panel    Component Value Date/Time   CHOL 164 01/31/2019 0849   TRIG 78 01/31/2019 0849   HDL 82 01/31/2019 0849   CHOLHDL 2.0 01/31/2019 0849   VLDL 16 01/31/2019 0849   LDLCALC 66 01/31/2019 0849    PHYSICAL EXAM:    VS:  BP 118/82 (BP Location: Left Arm, Patient Position: Sitting, Cuff Size: Normal)   Pulse 81   Ht 5\' 1"  (1.549 m)   Wt 119 lb 9.6 oz (54.3 kg)   SpO2 98%   BMI 22.60 kg/m   BMI: Body mass index is 22.6 kg/m.  Physical Exam Vitals reviewed.  Constitutional:       Appearance: She is well-developed.  HENT:     Head: Normocephalic and atraumatic.  Eyes:     General:        Right eye: No discharge.        Left eye: No discharge.  Neck:     Vascular: No JVD.  Cardiovascular:     Rate and Rhythm: Normal rate and regular rhythm.     Pulses:          Posterior tibial pulses are 2+ on the right side and 2+ on the left side.     Heart sounds: S1 normal and S2 normal. Heart sounds not distant. No midsystolic click and no opening snap. Murmur heard.     Harsh midsystolic murmur is present with a grade of 1/6 at the upper right sternal border radiating to the neck.     No friction rub.  Pulmonary:     Effort: Pulmonary effort is normal. No respiratory distress.     Breath sounds: Normal breath sounds. No decreased breath sounds, wheezing or rales.  Chest:     Chest wall: No tenderness.  Abdominal:     General: There is no distension.  Musculoskeletal:     Cervical back: Normal range of motion.     Right lower leg: No edema.     Left lower leg: No edema.  Skin:    General: Skin is warm and dry.     Nails: There is no clubbing.  Neurological:     Mental Status: She is alert and oriented to person, place, and time.  Psychiatric:        Speech: Speech normal.        Behavior: Behavior normal.        Thought Content: Thought content normal.  Judgment: Judgment normal.     Wt Readings from Last 3 Encounters:  03/07/23 119 lb 9.6 oz (54.3 kg)  03/04/22 125 lb (56.7 kg)  02/27/22 120 lb (54.4 kg)     ASSESSMENT & PLAN:   Nonobstructive CAD/HLD: No symptoms suggestive of angina or cardiac decompensation.  LDL 62 in 10/2022 with normal AST/ALT at that time.  Continue aggressive risk factor modification and primary prevention including aspirin, losartan, metoprolol succinate, and rosuvastatin.  No indication for ischemic testing at this time.  Aortic stenosis/mitral regurgitation: Mild aortic stenosis with mild to moderate mitral regurgitation  by echo in 2021.  Update echo.  PSVT: Quiescent on Toprol-XL.  HTN: Blood pressure is well-controlled in the office today.  She remains on losartan and Toprol-XL.  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  Asymptomatic.  Remains on aspirin and statin therapy.  Should she develop postprandial symptoms, would refer to vascular surgery.    Disposition: F/u with Dr. Okey Dupre or an APP in 12 months.   Medication Adjustments/Labs and Tests Ordered: Current medicines are reviewed at length with the patient today.  Concerns regarding medicines are outlined above. Medication changes, Labs and Tests ordered today are summarized above and listed in the Patient Instructions accessible in Encounters.   Signed, Eula Listen, PA-C 03/07/2023 1:18 PM     [facility] [place] [address] [place], [place] [address] [phone]

## 2023-03-05 ENCOUNTER — Other Ambulatory Visit: Payer: Self-pay | Admitting: Internal Medicine

## 2023-03-07 ENCOUNTER — Other Ambulatory Visit: Payer: Self-pay

## 2023-03-07 ENCOUNTER — Ambulatory Visit: Payer: Medicare Other | Attending: Physician Assistant | Admitting: Physician Assistant

## 2023-03-07 ENCOUNTER — Encounter: Payer: Self-pay | Admitting: Physician Assistant

## 2023-03-07 VITALS — BP 118/82 | HR 81 | Ht 61.0 in | Wt 119.6 lb

## 2023-03-07 DIAGNOSIS — I2510 Atherosclerotic heart disease of native coronary artery without angina pectoris: Secondary | ICD-10-CM

## 2023-03-07 DIAGNOSIS — I471 Supraventricular tachycardia, unspecified: Secondary | ICD-10-CM | POA: Diagnosis not present

## 2023-03-07 DIAGNOSIS — I10 Essential (primary) hypertension: Secondary | ICD-10-CM

## 2023-03-07 DIAGNOSIS — I340 Nonrheumatic mitral (valve) insufficiency: Secondary | ICD-10-CM | POA: Diagnosis not present

## 2023-03-07 DIAGNOSIS — E785 Hyperlipidemia, unspecified: Secondary | ICD-10-CM

## 2023-03-07 DIAGNOSIS — I350 Nonrheumatic aortic (valve) stenosis: Secondary | ICD-10-CM | POA: Diagnosis not present

## 2023-03-07 MED ORDER — ROSUVASTATIN CALCIUM 40 MG PO TABS: 40.0000 mg | ORAL_TABLET | Freq: Every day | ORAL | 3 refills | Status: DC

## 2023-03-07 NOTE — Patient Instructions (Signed)
Medication Instructions:  Your Physician recommend you continue on your current medication as directed.    *If you need a refill on your cardiac medications before your next appointment, please call your pharmacy*   Testing/Procedures: Your physician has requested that you have an echocardiogram. Echocardiography is a painless test that uses sound waves to create images of your heart. It provides your doctor with information about the size and shape of your heart and how well your heart's chambers and valves are working.   You may receive an ultrasound enhancing agent through an IV if needed to better visualize your heart during the echo. This procedure takes approximately one hour.  There are no restrictions for this procedure.  This will take place at [address] [address], [place] [address]   Follow-Up: At [facility], you and your health needs are our priority.  As part of our continuing mission to provide you with exceptional heart care, we have created designated Provider Care Teams.  These Care Teams include your primary Cardiologist (physician) and Advanced Practice Providers (APPs -  Physician Assistants and Nurse Practitioners) who all work together to provide you with the care you need, when you need it.  We recommend signing up for the patient portal called "MyChart".  Sign up information is provided on this After Visit Summary.  MyChart is used to connect with patients for Virtual Visits (Telemedicine).  Patients are able to view lab/test results, encounter notes, upcoming appointments, etc.  Non-urgent messages can be sent to your provider as well.   To learn more about what you can do with MyChart, go to [email or web address].    Your next appointment:   12 month(s)  Provider:   You may see Yvonne Kendall, MD or one of the following Advanced Practice Providers on your designated Care Team:   Eula Listen, [place]

## 2023-03-27 ENCOUNTER — Ambulatory Visit: Payer: Medicare Other | Attending: Physician Assistant

## 2023-03-27 DIAGNOSIS — I350 Nonrheumatic aortic (valve) stenosis: Secondary | ICD-10-CM

## 2023-03-27 LAB — ECHOCARDIOGRAM COMPLETE
AR max vel: 1.15 cm2
AV Area VTI: 1.27 cm2
AV Area mean vel: 1.12 cm2
AV Mean grad: 16 mm[Hg]
AV Peak grad: 28.7 mm[Hg]
Ao pk vel: 2.68 m/s
Area-P 1/2: 3.91 cm2
Radius: 0.4 cm
S' Lateral: 2.8 cm

## 2023-04-28 ENCOUNTER — Other Ambulatory Visit: Payer: Self-pay | Admitting: Internal Medicine

## 2023-05-01 NOTE — Telephone Encounter (Signed)
last visit: 03/07/23 with plan to f/u in 12 months.  next visit:  none/active recall

## 2023-05-15 ENCOUNTER — Other Ambulatory Visit: Payer: Self-pay | Admitting: Physician Assistant

## 2023-05-15 DIAGNOSIS — Z1231 Encounter for screening mammogram for malignant neoplasm of breast: Secondary | ICD-10-CM

## 2023-05-29 ENCOUNTER — Ambulatory Visit
Admission: RE | Admit: 2023-05-29 | Discharge: 2023-05-29 | Disposition: A | Discharge status: Home / Self Care | Payer: Medicare Other | Source: Ambulatory Visit | Attending: Physician Assistant | Admitting: Physician Assistant

## 2023-05-29 DIAGNOSIS — Z1231 Encounter for screening mammogram for malignant neoplasm of breast: Secondary | ICD-10-CM | POA: Insufficient documentation

## 2023-07-18 IMAGING — MG MM DIGITAL SCREENING BILAT W/ TOMO AND CAD
8 series · 9 of 24 positions shown · non-contrast
Comparison: Previous exam(s).

CLINICAL DATA: Screening.

EXAM:
DIGITAL SCREENING BILATERAL MAMMOGRAM WITH TOMOSYNTHESIS AND CAD
TECHNIQUE: Bilateral screening digital craniocaudal and mediolateral oblique
mammograms were obtained. Bilateral screening digital breast
tomosynthesis was performed. The images were evaluated with
computer-aided detection.

[L MLO synth-2D]
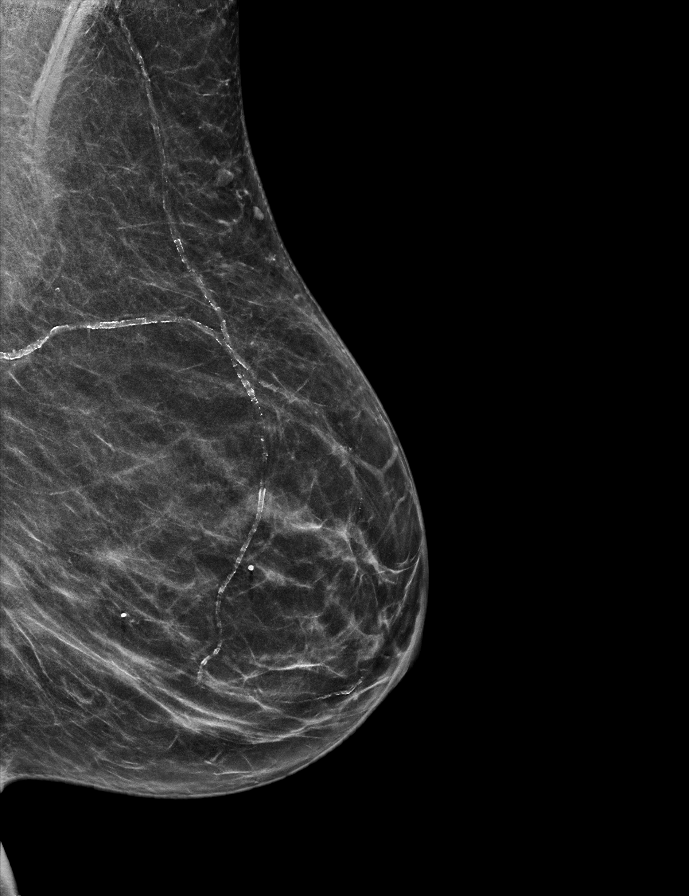

[R MLO synth-2D]
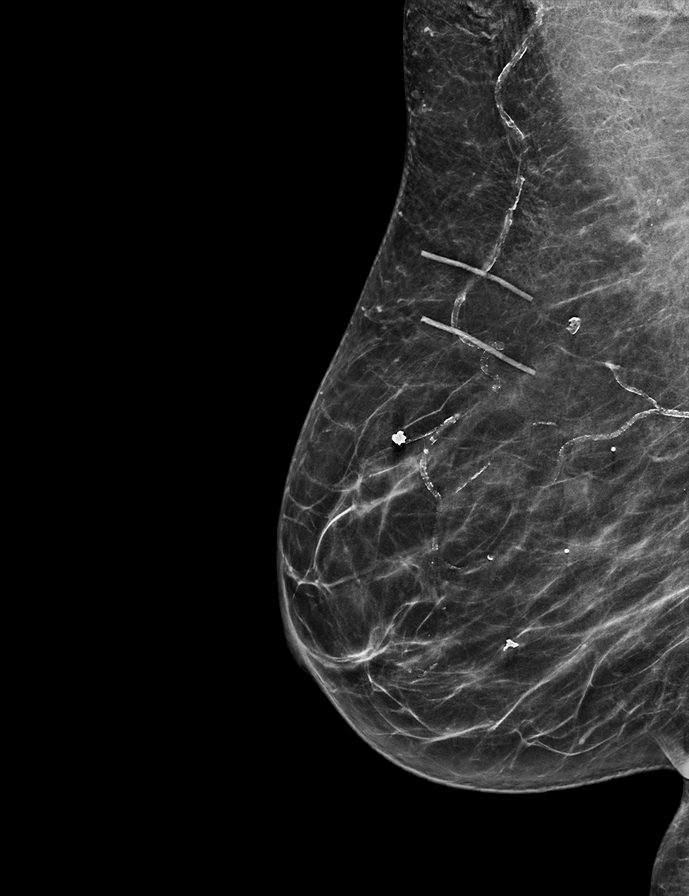

[L CC synth-2D]
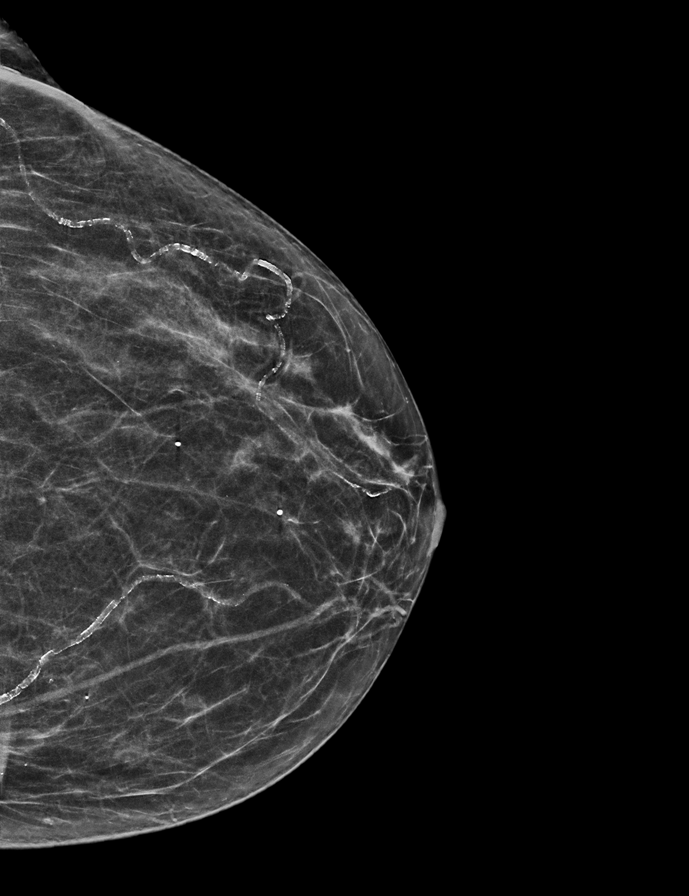

[R CC synth-2D]
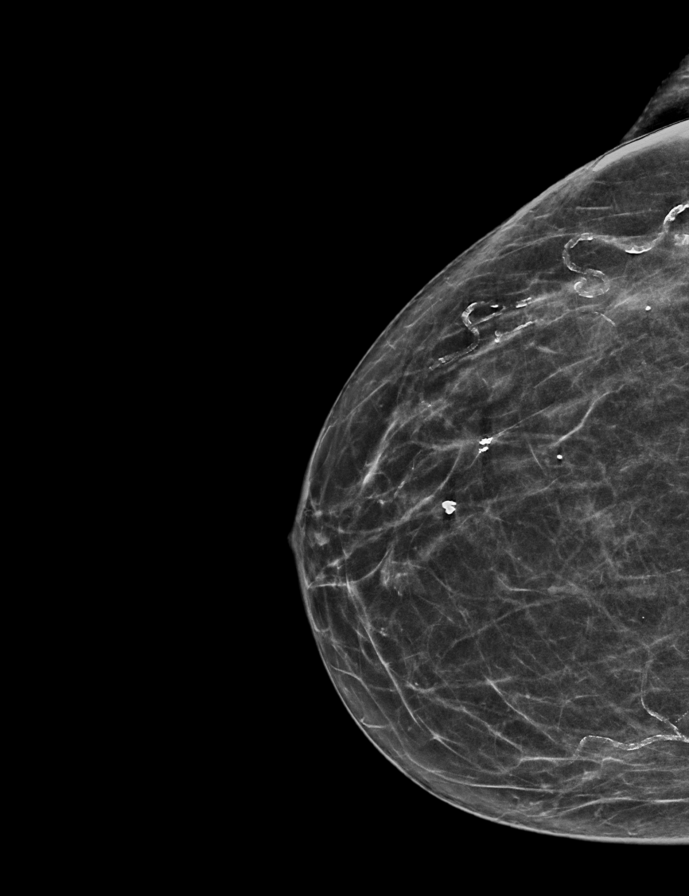

[R MLO tomo · 2 of 58 frames shown]
[frame 19/58]
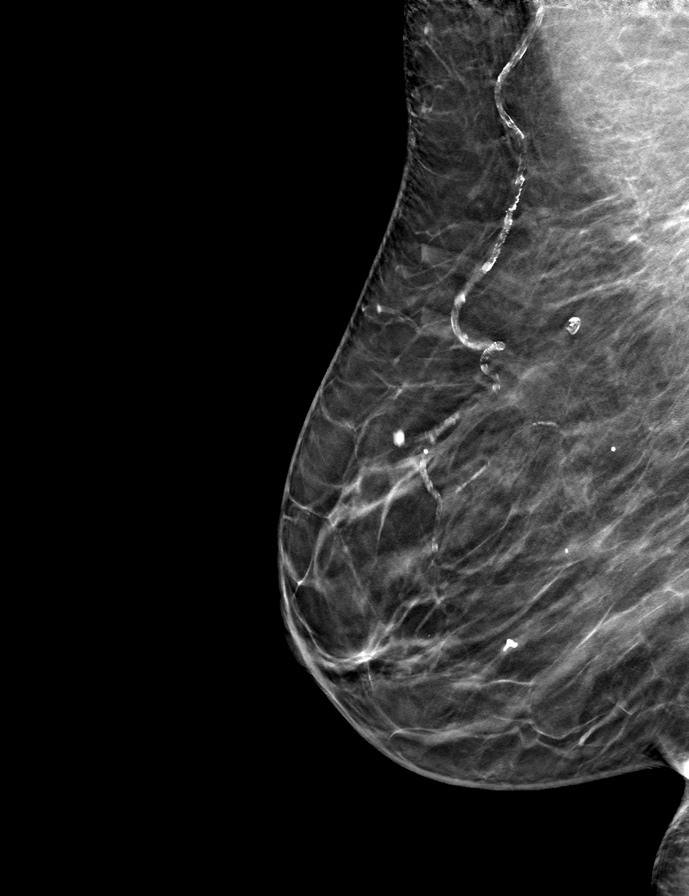
[frame 29/58]
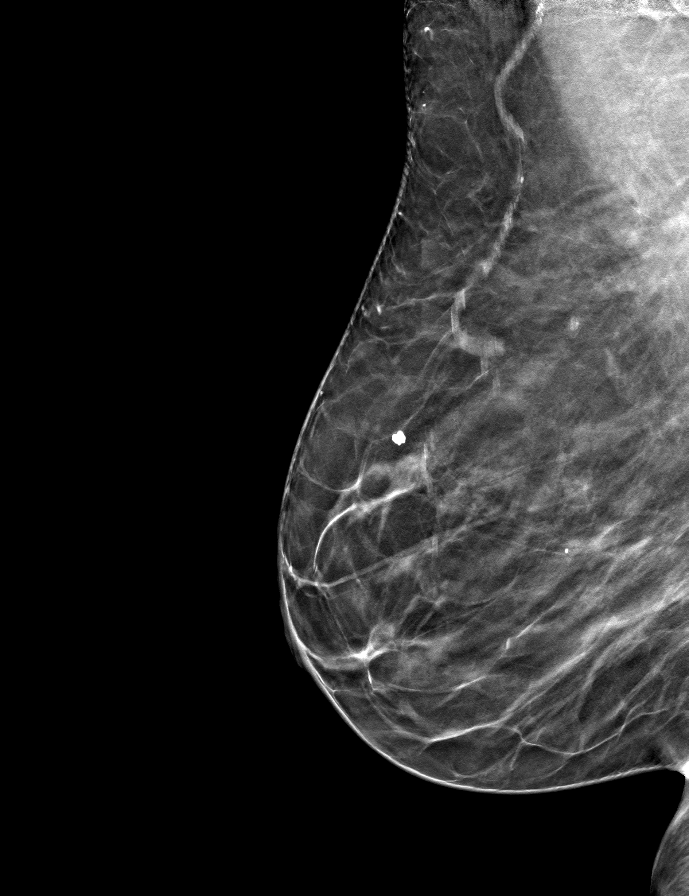

[R CC tomo · tomo slice 27/52.0]
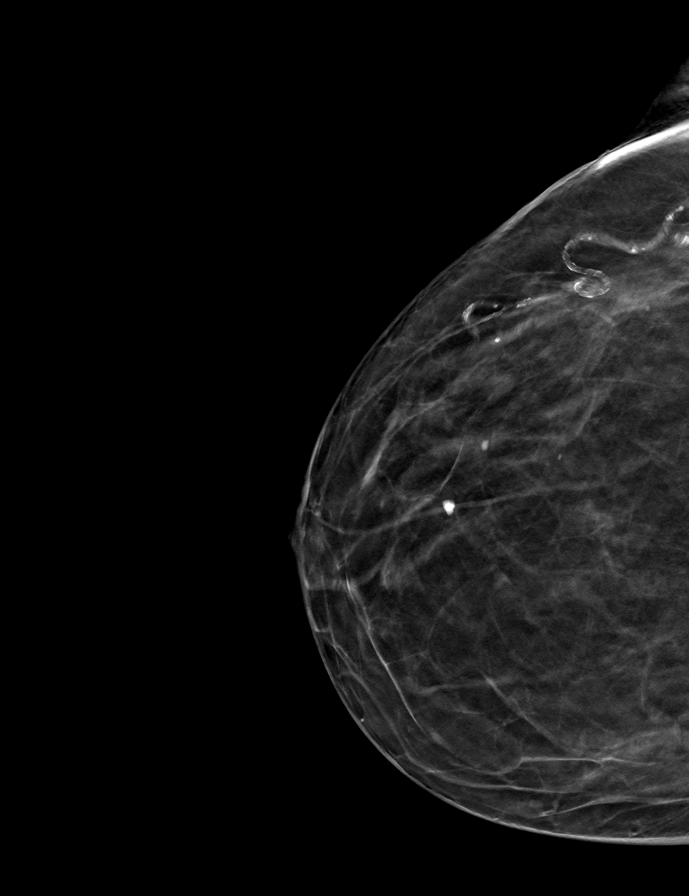

[L MLO tomo · tomo slice 29/58.0]
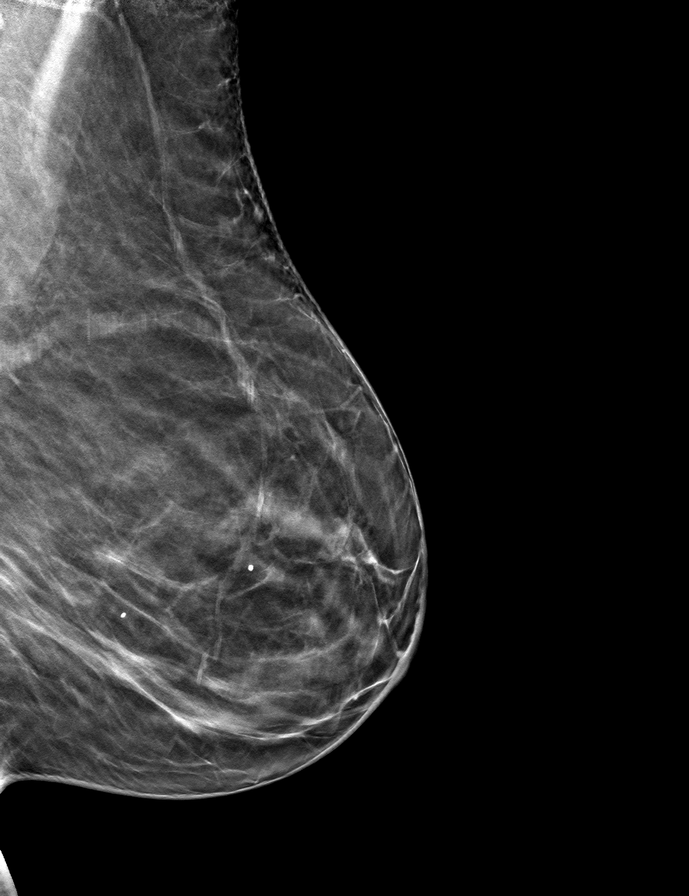

[L CC tomo · tomo slice 25/50.0]
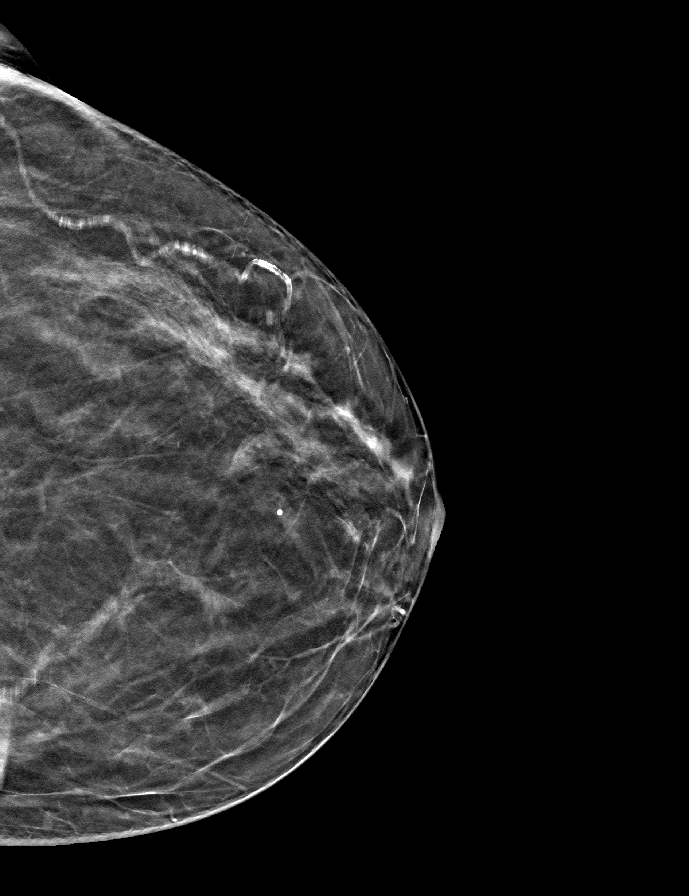

[9 of 24 positions shown; findings below may reference images not displayed]

ACR Breast Density Category b: There are scattered areas of
fibroglandular density.
FINDINGS: There are no findings suspicious for malignancy.
IMPRESSION: No mammographic evidence of malignancy. A result letter of this
screening mammogram will be mailed directly to the patient.

RECOMMENDATION:
Screening mammogram in one year. (Code:51-O-LD2)

BI-RADS CATEGORY  1: Negative.

## 2023-08-05 ENCOUNTER — Other Ambulatory Visit: Payer: Self-pay | Admitting: Internal Medicine

## 2023-10-01 ENCOUNTER — Other Ambulatory Visit: Payer: Self-pay | Admitting: Internal Medicine

## 2023-10-13 ENCOUNTER — Other Ambulatory Visit: Payer: Self-pay | Admitting: Internal Medicine

## 2023-11-17 ENCOUNTER — Other Ambulatory Visit: Payer: Self-pay | Admitting: Physician Assistant

## 2023-12-20 ENCOUNTER — Other Ambulatory Visit: Payer: Self-pay | Admitting: Internal Medicine

## 2024-01-01 ENCOUNTER — Other Ambulatory Visit: Payer: Self-pay | Admitting: Internal Medicine

## 2024-02-08 ENCOUNTER — Encounter: Payer: Self-pay | Admitting: Internal Medicine

## 2024-03-08 NOTE — Progress Notes (Unsigned)
 Cardiology Office Note    Date:  03/11/2024   ID:  Kristin, Davies 02-22-1946, MRN [id number]  PCP:  Kristin Davies POUR, PA  Cardiologist:  Kristin Hanson, MD  Electrophysiologist:  None   Chief Complaint: Follow up  History of Present Illness:   Kristin Davies is a 78 y.o. female with history of nonobstructive CAD by LHC in 10/2018, PSVT, DCIS of the breast, trigeminal neuralgia, meningioma, HTN, HLD, celiac artery stenosis, and lumbar radiculopathy who presents for follow-up of CAD, PSVT, and HTN.   Nuclear stress test in 10/2018 was abnormal, and potentially high risk with a moderate in size, mild in severity, reversible defect involving the mid and apical anterior/anterolateral segments concerning for ischemia, though artifact could not be excluded.  EF greater than 65%.  CT attenuation corrected images showed coronary calcification and aortic atherosclerosis.  LHC in 10/2018 showed mild to moderate nonobstructive CAD with normal LV systolic function and filling pressure.  Renal artery ultrasound in 07/2019 showed no evidence of renal artery stenosis bilaterally with a stenosis of the celiac artery noted.  Echo in 02/2020 showed an EF of 60 to 65%, no regional wall motion abnormalities, grade 1 diastolic dysfunction, normal RV systolic function and ventricular cavity size, normal PASP, mildly dilated left atrium, mild to moderate mitral regurgitation, mild aortic insufficiency, mild aortic stenosis, and an estimated right atrial pressure of 3 mmHg.  She was last seen in the office in 02/2023 and was doing well from a cardiac perspective with main limiting factor stemming from lumbar radiculopathy, peripheral neuropathy, and arthritis.  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  She comes in continuing to do well from a cardiac perspective and is without symptoms of angina or cardiac decompensation.  No palpitations, dizziness, presyncope, or syncope.  Main limiting factor continues to be lumbar to radiculopathy with peripheral neuropathy and arthritis.  Remains active at baseline though.  No falls or symptoms concerning for bleeding.  Overall feels well and does not have any acute cardiac concerns at this time.   Labs independently reviewed: 10/2023 - Hgb 13.1, PLT 208, potassium 4.7, BUN 25, serum creatinine 0.8, albumin 4.6, AST/ALT normal, TC 174, TG 73, HDL 82, LDL 77, A1c 5.8 07/2021 - TSH normal   Past Medical History:  Diagnosis Date   Angina at rest    Arthritis    Breast cancer (HCC) 2016   DCIS, ER negative, PR negative, high-grade. 3.5 cm. 1 mm deep margin (pectoralis fascia). MammoSite radiation.   Cancer (HCC) 03/26/2015   DCIS, ER negative, PR negative, high-grade. 3.5 cm. 1 mm deep margin (pectoralis fascia).   Coronary artery disease    Hypertension    Osteoarthritis    Osteoporosis    Peripheral neuropathy 07/2020   Personal history of radiation therapy 2016   BREAST CA   PONV (postoperative nausea and vomiting)    SVT (supraventricular tachycardia) 2012   Trigeminal neuralgia    Wears hearing aid in both ears     Past Surgical History:  Procedure Laterality Date   BREAST BIOPSY Right 1985   Negative   BREAST BIOPSY Right  03/02/2015   DCIS   BREAST CYST ASPIRATION Right 1985   Negative   BREAST FIBROADENOMA SURGERY Right 1982   [place]     BREAST LUMPECTOMY Right 03/26/2015   Procedure: BREAST LUMPECTOMY;  Surgeon: Reyes LELON Cota, MD;  Location: [facility];  Service: General;  Laterality: Right;   BREAST MAMMOSITE Right 04/28/2015    BUNIONECTOMY WITH HAMMERTOE RECONSTRUCTION Left 2012   CARDIAC CATHETERIZATION     CATARACT EXTRACTION W/PHACO Right 12/08/2021   Procedure: CATARACT EXTRACTION PHACO AND INTRAOCULAR LENS PLACEMENT (IOC) RIGHT;  Surgeon: Mittie Gaskin, MD;  Location: [facility];  Service: Ophthalmology;  Laterality: Right;  13.96 01:21.9    CATARACT EXTRACTION W/PHACO Left 12/29/2021   Procedure: CATARACT EXTRACTION PHACO AND INTRAOCULAR LENS PLACEMENT (IOC) LEFT;  Surgeon: Mittie Gaskin, MD;  Location: [facility];  Service: Ophthalmology;  Laterality: Left;  5.77 0:56.8   COLONOSCOPY  2013   Dr Viktoria   CRANIOPLASTY  2014   HALLUX VALGUS CORRECTION  2013   LEFT HEART CATH AND CORONARY ANGIOGRAPHY N/A 11/16/2018   Procedure: LEFT HEART CATH AND CORONARY ANGIOGRAPHY;  Surgeon: Mady Bruckner, MD;  Location: [facility];  Service: Cardiovascular;  Laterality: N/A;   VAGINAL HYSTERECTOMY  1978    Current Medications: Current Meds  Medication Sig   acetaminophen  (TYLENOL ) 500 MG tablet Take 500 mg by mouth every 6 (six) hours as needed.   Alpha-Lipoic Acid 600 MG TABS Take by mouth daily.   aspirin  EC 81 MG EC tablet Take 1 tablet (81 mg total) by mouth daily.   baclofen (LIORESAL) 10 MG tablet Take by mouth.   Biotin 5 MG TBDP Take 1 tablet by mouth daily.    cholecalciferol (VITAMIN D) 25 MCG (1000 UT) tablet Take 5,000 Units by mouth daily.    Cyanocobalamin  (VITAMIN B-12) 1000 MCG SUBL Take 1 tablet by mouth daily.    Glucosamine-Chondroit-Vit C-Mn (GLUCOSAMINE CHONDR 1500 COMPLX) CAPS Take 2 capsules by mouth daily.    lamoTRIgine  (LAMICTAL ) 100 MG tablet Take 125-150 mg by mouth 2 (two) times daily. 125 mg QAM, 150 QPM   lamoTRIgine  (LAMICTAL ) 25 MG tablet Take 1 tablet in the morning and 2 tablets in the evening   losartan  (COZAAR ) 50 MG tablet TAKE 1 TABLET BY MOUTH DAILY   Lutein 40 MG CAPS Take 1 capsule by mouth daily.    metoprolol  succinate (TOPROL -XL)  100 MG 24 hr tablet TAKE 1 TABLET BY MOUTH DAILY  WITH OR IMMEDIATELY FOLLOWING A  MEAL   Multiple Vitamin (MULTIVITAMIN) capsule Take 1 capsule by mouth daily.   nitroGLYCERIN  (NITROSTAT ) 0.4 MG SL tablet Place 1 tablet (0.4 mg total) under the tongue every 5 (five) minutes as needed for chest pain.   NON FORMULARY Take 700 mg by mouth daily. Garlic and ginger   psyllium (REGULOID) 0.52 g capsule Take 2,625 mg by mouth daily. PRN   rosuvastatin  (CRESTOR ) 40 MG tablet TAKE 1 TABLET BY MOUTH DAILY  PLEASE CALL OFFICE TO SCHEDULE  APPOINTMENT PRIOR TO NEXT REFILL   vitamin C  (ASCORBIC ACID ) 500 MG tablet Take 500 mg by mouth daily.    XIIDRA 5 % SOLN Apply 1 drop to eye at bedtime. To both eyes    Allergies:   Patient has no known allergies.   Social History   Socioeconomic History   Marital status: Married    Spouse name: Joe   Number of children: 3   Years of education: Not on file   Highest  education level: Not on file  Occupational History   Not on file  Tobacco Use   Smoking status: Never   Smokeless tobacco: Never  Vaping Use   Vaping status: Never Used  Substance and Sexual Activity   Alcohol use: Yes    Alcohol/week: 7.0 standard drinks of alcohol    Types: 7 Glasses of wine per week    Comment: 5 oz of wine 7 days per week   Drug use: No   Sexual activity: Not on file  Other Topics Concern   Not on file  Social History Narrative   Not on file   Social Drivers of Health   Financial Resource Strain: Low Risk  (05/15/2023)   Received from [facility]   Overall Financial Resource Strain (CARDIA)    Difficulty of Paying Living Expenses: Not hard at all  Food Insecurity: No Food Insecurity (05/15/2023)   Received from [facility]   Hunger Vital Sign    Within the past 12 months, you worried that your food would run out before you got the money to buy more.: Never true    Within the past 12 months, the food you bought just didn't  last and you didn't have money to get more.: Never true  Transportation Needs: No Transportation Needs (05/15/2023)   Received from [facility] - Transportation    In the past 12 months, has lack of transportation kept you from medical appointments or from getting medications?: No    Lack of Transportation (Non-Medical): No  Physical Activity: Sufficiently Active (11/16/2018)   Exercise Vital Sign    Days of Exercise per Week: 4 days    Minutes of Exercise per Session: 40 min  Stress: No Stress Concern Present (11/16/2018)   [employer] of Occupational Health - Occupational Stress Questionnaire    Feeling of Stress : Only a little  Social Connections: Unknown (11/16/2018)   Social Connection and Isolation Panel    Frequency of Communication with Friends and Family: More than three times a week    Frequency of Social Gatherings with Friends and Family: Not on file    Attends Religious Services: Not on Marketing executive or Organizations: Not on file    Attends Banker Meetings: Not on file    Marital Status: Not on file     Family History:  The patient's family history includes Alzheimer's disease in her mother; Breast cancer in her paternal aunt; Heart disease in her father, maternal grandfather, and paternal grandmother.  ROS:   12-point review of systems is negative unless otherwise noted in the HPI.   EKGs/Labs/Other Studies Reviewed:    Studies reviewed were summarized above. The additional studies were reviewed today:  2D echo 03/27/2023: 1. Left ventricular ejection fraction, by estimation, is 60 to 65%. The  left ventricle has normal function. The left ventricle has no regional  wall motion abnormalities. There is mild left ventricular hypertrophy.  Left ventricular diastolic parameters  were normal. The average left ventricular global longitudinal strain is  -17.9 %. The global longitudinal strain is normal.   2.  Right ventricular systolic function is normal. The right ventricular  size is normal.   3. Left atrial size was moderately dilated.   4. The mitral valve is myxomatous. Moderate mitral valve regurgitation.  There is mild holosystolic prolapse of both leaflets of the mitral valve.   5. The aortic valve is  calcified. Aortic valve regurgitation is mild.  Mild to moderate aortic valve stenosis. Aortic valve area, by VTI measures  1.27 cm. Aortic valve mean gradient measures 16.0 mmHg.   6. The inferior vena cava is normal in size with greater than 50%  respiratory variability, suggesting right atrial pressure of 3 mmHg.  __________  2D echo 03/05/2020: 1. Left ventricular ejection fraction, by estimation, is 60 to 65%. The  left ventricle has normal function. The left ventricle has no regional  wall motion abnormalities. Left ventricular diastolic parameters are  consistent with Grade I diastolic  dysfunction (impaired relaxation).   2. Right ventricular systolic function is normal. The right ventricular  size is normal. There is normal pulmonary artery systolic pressure.   3. Left atrial size was mildly dilated.   4. The mitral valve is normal in structure. Mild to moderate mitral valve  regurgitation.   5. The aortic valve has an indeterminant number of cusps. Aortic valve  regurgitation is mild. Mild aortic valve stenosis.   6. The inferior vena cava is normal in size with greater than 50%  respiratory variability, suggesting right atrial pressure of 3 mmHg.  __________   Renal artery ultrasound 08/09/2019: Summary:  Largest Aortic Diameter: 2.2 cm    Renal:    Right: Normal size right kidney. Normal right Resisitive Index.         Normal cortical thickness of right kidney. No evidence of         right renal artery stenosis. RRV flow present.  Left:  No evidence of left renal artery stenosis. Normal size of         left kidney. Normal left Resistive Index. LRV flow present.          Normal cortical thickness of the left kidney.  Mesenteric:  Normal Superior Mesenteric artery findings. 70 to 99% stenosis in the  celiac artery.  __________   LHC 11/16/2018: Conclusions: Mild to moderate, non-obstructive coronary artery disease. Normal left ventricular systolic function and filling pressure.   Recommendations: Continue current medical therapy and risk factor modification. Follow-up in the office in ~2 weeks. __________   Lexiscan  MPI 11/13/2018:   Abnormal, potentially high risk pharmacologic myocardial perfusion stress test. There is a moderate in size, mild in severity, reversible defect involving the mid and apical anterior/anterolateral segments concerning for ischemia though artifact cannot be excluded (breast attenuation and misregistration). Left ventricular systolic function is normal (LVEF > 65%). Borderline transient ischemic dilation was noted, which is nonspecific but can be seen with balanced ischemia. Attenuation correction CT demonstrates coronary artery and aortic atherosclerotic calcification.   EKG:  EKG is ordered today.  The EKG ordered today demonstrates NSR, 77 bpm, left axis deviation, poor R wave progression along the precordial leads, no acute st/t changes, consistent with prior tracings  Recent Labs: No results found for requested labs within last 365 days.  Recent Lipid Panel    Component Value Date/Time   CHOL 164 01/31/2019 0849   TRIG 78 01/31/2019 0849   HDL 82 01/31/2019 0849   CHOLHDL 2.0 01/31/2019 0849   VLDL 16 01/31/2019 0849   LDLCALC 66 01/31/2019 0849    PHYSICAL EXAM:    VS:  BP 122/70 (BP Location: Left Arm, Patient Position: Sitting, Cuff Size: Normal)   Pulse 77   Ht 5' 1 (1.549 m)   Wt 121 lb 4 oz (55 kg)   SpO2 98%   BMI 22.91 kg/m   BMI: Body mass index is  22.91 kg/m.  Physical Exam Vitals reviewed.  Constitutional:      Appearance: She is well-developed.  HENT:     Head: Normocephalic and  atraumatic.  Eyes:     General:        Right eye: No discharge.        Left eye: No discharge.  Cardiovascular:     Rate and Rhythm: Normal rate and regular rhythm.     Heart sounds: S1 normal and S2 normal. Heart sounds not distant. No midsystolic click and no opening snap. Murmur heard.     Systolic murmur is present with a grade of 1/6 at the upper left sternal border.     No friction rub.  Pulmonary:     Effort: Pulmonary effort is normal. No respiratory distress.     Breath sounds: Normal breath sounds. No decreased breath sounds, wheezing, rhonchi or rales.  Musculoskeletal:     Cervical back: Normal range of motion.  Skin:    General: Skin is warm and dry.     Nails: There is no clubbing.  Neurological:     Mental Status: She is alert and oriented to person, place, and time.  Psychiatric:        Speech: Speech normal.        Behavior: Behavior normal.        Thought Content: Thought content normal.        Judgment: Judgment normal.     Wt Readings from Last 3 Encounters:  03/11/24 121 lb 4 oz (55 kg)  03/07/23 119 lb 9.6 oz (54.3 kg)  03/04/22 125 lb (56.7 kg)     ASSESSMENT & PLAN:   Nonobstructive CAD/HLD: She is doing well and without symptoms suggestive of angina or cardiac decompensation.  LDL 77 in 10/2023 with normal AST/ALT at that time.  Continue aggressive risk factor modification and primary prevention including aspirin  81 mg, Toprol -XL 100 mg, and rosuvastatin  40 mg.  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  Update echo.  If there is significant progression of mitral valve disease consider TEE.  PSVT: Quiescent on Toprol -XL.  HTN: Blood pressure is well-controlled  in the office today.  Remains on losartan  50 mg and Toprol -XL 100 mg.  Celiac artery stenosis: Asymptomatic.  Incidentally noted on abdominal imaging.  Remains on aspirin  and statin therapy.  Should she develop postprandial symptoms would refer to vascular surgery.    Disposition: F/u with Dr. Mady or an APP in 12 months.   Medication Adjustments/Labs and Tests Ordered: Current medicines are reviewed at length with the patient today.  Concerns regarding medicines are outlined above. Medication changes, Labs and Tests ordered today are summarized above and listed in the Patient Instructions accessible in Encounters.   Signed, Bernardino Bring, PA-C 03/11/2024 12:07 PM     [facility] [place] [address] [place], [place] [address] [phone]

## 2024-03-11 ENCOUNTER — Ambulatory Visit: Attending: Physician Assistant | Admitting: Physician Assistant

## 2024-03-11 ENCOUNTER — Other Ambulatory Visit: Payer: Self-pay | Admitting: Internal Medicine

## 2024-03-11 ENCOUNTER — Encounter: Payer: Self-pay | Admitting: Physician Assistant

## 2024-03-11 VITALS — BP 122/70 | HR 77 | Ht 61.0 in | Wt 121.2 lb

## 2024-03-11 DIAGNOSIS — E785 Hyperlipidemia, unspecified: Secondary | ICD-10-CM

## 2024-03-11 DIAGNOSIS — I10 Essential (primary) hypertension: Secondary | ICD-10-CM

## 2024-03-11 DIAGNOSIS — I350 Nonrheumatic aortic (valve) stenosis: Secondary | ICD-10-CM | POA: Diagnosis not present

## 2024-03-11 DIAGNOSIS — I340 Nonrheumatic mitral (valve) insufficiency: Secondary | ICD-10-CM

## 2024-03-11 DIAGNOSIS — I2510 Atherosclerotic heart disease of native coronary artery without angina pectoris: Secondary | ICD-10-CM

## 2024-03-11 DIAGNOSIS — I471 Supraventricular tachycardia, unspecified: Secondary | ICD-10-CM | POA: Diagnosis not present

## 2024-03-11 NOTE — Patient Instructions (Signed)
 Medication Instructions:  No changes *If you need a refill on your cardiac medications before your next appointment, please call your pharmacy*  Lab Work: None ordered If you have labs (blood work) drawn today and your tests are completely normal, you will receive your results only by: MyChart Message (if you have MyChart) OR A paper copy in the mail If you have any lab test that is abnormal or we need to change your treatment, we will call you to review the results.  Testing/Procedures: Your physician has requested that you have an echocardiogram. Echocardiography is a painless test that uses sound waves to create images of your heart. It provides your doctor with information about the size and shape of your heart and how well your heart's chambers and valves are working.   You may receive an ultrasound enhancing agent through an IV if needed to better visualize your heart during the echo. This procedure takes approximately one hour.  There are no restrictions for this procedure.  This will take place at [address] [facility], [place] [address]  Please note: We ask at that you not bring children with you during ultrasound (echo/ vascular) testing. Due to room size and safety concerns, children are not allowed in the ultrasound rooms during exams. Our front office staff cannot provide observation of children in our lobby area while testing is being conducted. An adult accompanying a patient to their appointment will only be allowed in the ultrasound room at the discretion of the ultrasound technician under special circumstances. We apologize for any inconvenience.   Follow-Up: At [facility], you and your health needs are our priority.  As part of our continuing mission to provide you with exceptional heart care, our providers are all part of one team.  This team includes your primary Cardiologist (physician) and Advanced Practice Providers or APPs  (Physician Assistants and Nurse Practitioners) who all work together to provide you with the care you need, when you need it.  Your next appointment:   12 month(s)  Provider:   Bernardino Bring, PA-C    We recommend signing up for the patient portal called MyChart.  Sign up information is provided on this After Visit Summary.  MyChart is used to connect with patients for Virtual Visits (Telemedicine).  Patients are able to view lab/test results, encounter notes, upcoming appointments, etc.  Non-urgent messages can be sent to your provider as well.   To learn more about what you can do with MyChart, go to [email or web address].

## 2024-03-12 ENCOUNTER — Ambulatory Visit: Payer: Self-pay

## 2024-04-29 ENCOUNTER — Ambulatory Visit: Attending: Physician Assistant

## 2024-04-29 DIAGNOSIS — I340 Nonrheumatic mitral (valve) insufficiency: Secondary | ICD-10-CM

## 2024-04-29 LAB — ECHOCARDIOGRAM COMPLETE
AR max vel: 0.95 cm2
AV Area VTI: 0.95 cm2
AV Area mean vel: 0.96 cm2
AV Mean grad: 20 mmHg
AV Peak grad: 35.3 mmHg
Ao pk vel: 2.97 m/s
Area-P 1/2: 4.49 cm2
S' Lateral: 2.93 cm

## 2024-05-21 ENCOUNTER — Other Ambulatory Visit: Payer: Self-pay | Admitting: Unknown Physician Specialty

## 2024-05-21 DIAGNOSIS — H90A21 Sensorineural hearing loss, unilateral, right ear, with restricted hearing on the contralateral side: Secondary | ICD-10-CM

## 2024-05-21 DIAGNOSIS — R42 Dizziness and giddiness: Secondary | ICD-10-CM

## 2024-06-04 ENCOUNTER — Inpatient Hospital Stay
Admission: RE | Admit: 2024-06-04 | Discharge: 2024-06-04 | Disposition: A | Source: Ambulatory Visit | Attending: Unknown Physician Specialty | Admitting: Unknown Physician Specialty

## 2024-06-04 DIAGNOSIS — R42 Dizziness and giddiness: Secondary | ICD-10-CM

## 2024-06-04 DIAGNOSIS — H90A21 Sensorineural hearing loss, unilateral, right ear, with restricted hearing on the contralateral side: Secondary | ICD-10-CM

## 2024-06-04 MED ORDER — GADOPICLENOL 0.5 MMOL/ML IV SOLN
7.5000 mL | Freq: Once | INTRAVENOUS | Status: AC | PRN
  Administered 2024-06-04: 5 mL via INTRAVENOUS

## 2024-06-11 ENCOUNTER — Other Ambulatory Visit: Payer: Self-pay | Admitting: Physician Assistant

## 2024-06-11 DIAGNOSIS — Z1231 Encounter for screening mammogram for malignant neoplasm of breast: Secondary | ICD-10-CM

## 2024-07-12 ENCOUNTER — Encounter
